# Patient Record
Sex: Male | Born: 1955 | Race: Black or African American | Hispanic: No | Marital: Married | Smoking: Never smoker
Health system: Southern US, Community
[De-identification: ages and names within clinical notes are randomized; demographics above are authoritative.]

## PROBLEM LIST (undated history)

## (undated) DIAGNOSIS — E119 Type 2 diabetes mellitus without complications: Secondary | ICD-10-CM

## (undated) DIAGNOSIS — I10 Essential (primary) hypertension: Secondary | ICD-10-CM

## (undated) HISTORY — DX: Type 2 diabetes mellitus without complications: E11.9

## (undated) HISTORY — DX: Essential (primary) hypertension: I10

---

## 1999-04-23 ENCOUNTER — Emergency Department (HOSPITAL_COMMUNITY): Admission: EM | Admit: 1999-04-23 | Discharge: 1999-04-23 | Payer: Self-pay | Admitting: Emergency Medicine

## 2004-04-03 ENCOUNTER — Ambulatory Visit
Admit: 2004-04-03 | Disposition: A | Discharge status: Home / Self Care | Payer: Self-pay | Source: Ambulatory Visit | Admitting: Internal Medicine

## 2004-06-13 ENCOUNTER — Emergency Department: Admit: 2004-06-13 | Payer: Self-pay | Source: Emergency Department | Admitting: Emergency Medicine

## 2004-06-13 ENCOUNTER — Emergency Department: Admit: 2004-06-13 | Payer: Self-pay | Source: Emergency Department | Admitting: Pediatric Emergency Medicine

## 2006-05-26 ENCOUNTER — Emergency Department: Admit: 2006-05-26 | Payer: Self-pay | Source: Emergency Department

## 2008-09-28 ENCOUNTER — Emergency Department: Payer: Self-pay | Admitting: Emergency Medicine

## 2010-03-02 ENCOUNTER — Inpatient Hospital Stay
Admission: EM | Admit: 2010-03-02 | Disposition: A | Discharge status: Other Acute Inpatient Hospital | Payer: Self-pay | Source: Emergency Department | Admitting: Internal Medicine

## 2010-03-08 LAB — COMPREHENSIVE METABOLIC PANEL
ALT: 25 U/L (ref 7–56)
ALT: 24 U/L (ref 7–56)
AST (SGOT): 20 U/L (ref 5–40)
AST (SGOT): 17 U/L (ref 5–40)
Albumin, Synovial: 4 g/dL (ref 3.9–5.0)
Albumin, Synovial: 3.6 g/dL — ABNORMAL LOW (ref 3.9–5.0)
Alkaline Phosphatase: 64 U/L (ref 38–126)
Alkaline Phosphatase: 57 U/L (ref 38–126)
BUN / Creatinine Ratio: 16 (ref 8–20)
BUN / Creatinine Ratio: 20 (ref 8–20)
BUN: 24 mg/dL — ABNORMAL HIGH (ref 6–20)
BUN: 25 mg/dL — ABNORMAL HIGH (ref 6–20)
Bilirubin, Total: 0.6 mg/dL (ref 0.2–1.3)
Bilirubin, Total: 0.5 mg/dL (ref 0.2–1.3)
CO2: 26 mmol/L (ref 21.0–31.0)
CO2: 22 mmol/L (ref 21.0–31.0)
Calcium: 9.6 mg/dL (ref 8.4–10.2)
Calcium: 9 mg/dL (ref 8.4–10.2)
Chloride: 104 mmol/L (ref 101–111)
Chloride: 108 mmol/L (ref 101–111)
Creatinine: 1.54 mg/dL — ABNORMAL HIGH (ref 0.66–1.25)
Creatinine: 1.23 mg/dL (ref 0.66–1.25)
EGFR: 47 mL/min/{1.73_m2}
EGFR: 57 mL/min/{1.73_m2}
EGFR: >60 mL/min/{1.73_m2}
EGFR: >60 mL/min/{1.73_m2}
Glucose: 142 mg/dL — ABNORMAL HIGH (ref 70–100)
Glucose: 167 mg/dL — ABNORMAL HIGH (ref 70–100)
Potassium: 4.4 mmol/L (ref 3.6–5.0)
Potassium: 4.9 mmol/L (ref 3.6–5.0)
Protein, Total: 7.4 g/dL (ref 6.3–8.2)
Protein, Total: 6.6 g/dL (ref 6.3–8.2)
Sodium: 141 mmol/L (ref 135–145)
Sodium: 140 mmol/L (ref 135–145)

## 2010-03-08 LAB — CBC AND DIFFERENTIAL
BASOPHILS %: 0.2 % (ref 0.0–2.0)
BASOPHILS %: 0.1 % (ref 0.0–2.0)
Baso(Absolute): 0.02 10*3/uL (ref 0.00–0.20)
Baso(Absolute): 0.01 10*3/uL (ref 0.00–0.20)
Eosinophils %: 0.4 % (ref 0.0–6.0)
Eosinophils %: 0 % (ref 0.0–6.0)
Eosinophils Absolute: 0.05 10*3/uL — ABNORMAL LOW (ref 0.10–0.30)
Eosinophils Absolute: 0 10*3/uL — ABNORMAL LOW (ref 0.10–0.30)
Hematocrit: 39.5 % (ref 39.0–49.0)
Hematocrit: 38.9 % — ABNORMAL LOW (ref 39.0–49.0)
Hematocrit: 39.8 % (ref 39.0–49.0)
Hemoglobin: 13.8 g/dL (ref 13.2–17.3)
Hemoglobin: 13.2 g/dL (ref 13.2–17.3)
Hemoglobin: 14.3 g/dL (ref 13.2–17.3)
Immature Granulocytes #: 0.07 10*3/uL — ABNORMAL HIGH (ref 0.00–0.05)
Immature Granulocytes #: 0.06 10*3/uL — ABNORMAL HIGH (ref 0.00–0.05)
Immature Granulocytes %: 0.6 % — ABNORMAL HIGH (ref 0.0–0.0)
Immature Granulocytes %: 0.6 % — ABNORMAL HIGH (ref 0.0–0.0)
Lymphocytes Absolute: 1.84 10*3/uL (ref 1.00–4.80)
Lymphocytes Absolute: 0.98 10*3/uL — ABNORMAL LOW (ref 1.00–4.80)
Lymphocytes Relative: 16.3 % — ABNORMAL LOW (ref 25.0–55.0)
Lymphocytes Relative: 9.9 % — ABNORMAL LOW (ref 25.0–55.0)
MCH: 30.5 pg (ref 27.0–34.0)
MCH: 29.8 pg (ref 27.0–34.0)
MCH: 30.4 pg (ref 27.0–34.0)
MCHC: 34.9 g/dL (ref 32.0–36.0)
MCHC: 33.9 g/dL (ref 32.0–36.0)
MCHC: 35.9 g/dL (ref 32.0–36.0)
MCV: 87.2 fL (ref 80–100)
MCV: 87.8 fL (ref 80–100)
MCV: 84.5 fL (ref 80–100)
MPV: 9.7 fL (ref 9.0–13.0)
MPV: 9.6 fL (ref 9.0–13.0)
MPV: 9.5 fL (ref 9.0–13.0)
Monocytes Absolute: 1.01 10*3/uL (ref 0.10–1.20)
Monocytes Absolute: 0.09 10*3/uL — ABNORMAL LOW (ref 0.10–1.20)
Monocytes Relative %: 9 % — ABNORMAL HIGH (ref 1.0–8.0)
Monocytes Relative %: 0.9 % — ABNORMAL LOW (ref 1.0–8.0)
Neutrophils Absolute: 8.36 10*3/uL — ABNORMAL HIGH (ref 1.80–7.70)
Neutrophils Absolute: 8.83 10*3/uL — ABNORMAL HIGH (ref 1.80–7.70)
Neutrophils Relative %: 74.1 % — ABNORMAL HIGH (ref 49.0–69.0)
Neutrophils Relative %: 89.1 % — ABNORMAL HIGH (ref 49.0–69.0)
Nucleated RBC %: 0 /100WBC (ref 0.0–0.0)
Nucleated RBC %: 0 /100WBC (ref 0.0–0.0)
Nucleted RBC #: 0 10*3/uL (ref 0.00–0.00)
Nucleted RBC #: 0 10*3/uL (ref 0.00–0.00)
Platelets: 394 10*3/uL (ref 150–400)
Platelets: 392 10*3/uL (ref 150–400)
Platelets: 373 10*3/uL (ref 150–400)
RBC: 4.53 M/uL (ref 3.80–5.40)
RBC: 4.43 M/uL (ref 3.80–5.40)
RBC: 4.71 M/uL (ref 3.80–5.40)
RDW: 12.3 % (ref 11.0–14.0)
RDW: 12.3 % (ref 11.0–14.0)
RDW: 12.2 % (ref 11.0–14.0)
WBC: 11.28 10*3/uL — ABNORMAL HIGH (ref 4.80–10.80)
WBC: 9.91 10*3/uL (ref 4.80–10.80)
WBC: 10.91 10*3/uL — ABNORMAL HIGH (ref 4.80–10.80)

## 2010-03-08 LAB — CHLAMYDIA ANTIBODIES
C. Pneumoniae IgG Titer: 1:512 {titer}
C. Pneumoniae IgM Titer: <1:20 {titer}
C. Psittaci IgG Titer: 1:128 {titer}
C. Psittaci IgM Titer: <1:20 {titer}
C. Trachomatis IgG Titer: 1:128 {titer}
C. Trachomatis IgM Titer: <1:20 {titer}

## 2010-03-08 LAB — BASIC METABOLIC PANEL
BUN / Creatinine Ratio: 20 (ref 8–20)
BUN / Creatinine Ratio: 23 — ABNORMAL HIGH (ref 8–20)
BUN: 18 mg/dL (ref 6–20)
BUN: 20 mg/dL (ref 6–20)
CO2: 22 mmol/L (ref 21.0–31.0)
CO2: 27 mmol/L (ref 21.0–31.0)
Calcium: 9 mg/dL (ref 8.4–10.2)
Calcium: 9.5 mg/dL (ref 8.4–10.2)
Chloride: 107 mmol/L (ref 101–111)
Chloride: 99 mmol/L — ABNORMAL LOW (ref 101–111)
Creatinine: 0.91 mg/dL (ref 0.66–1.25)
Creatinine: 0.86 mg/dL (ref 0.66–1.25)
EGFR: >60 mL/min/{1.73_m2}
EGFR: >60 mL/min/{1.73_m2}
EGFR: >60 mL/min/{1.73_m2}
EGFR: >60 mL/min/{1.73_m2}
Glucose: 218 mg/dL — ABNORMAL HIGH (ref 70–100)
Glucose: 148 mg/dL — ABNORMAL HIGH (ref 70–100)
Potassium: 4.4 mmol/L (ref 3.6–5.0)
Potassium: 3.9 mmol/L (ref 3.6–5.0)
Sodium: 140 mmol/L (ref 135–145)
Sodium: 137 mmol/L (ref 135–145)

## 2010-03-08 LAB — INFLUENZA ANTIGEN
Influenza A: NEGATIVE
Influenza B: NEGATIVE

## 2010-03-08 LAB — T4, FREE: T4 Free: 1.14 ng/dL (ref 0.78–2.19)

## 2010-03-08 LAB — MAN DIFF ONLY
Basophil # Calc: 0 10*3/uL (ref 0.00–0.20)
Basophils Manual: 0 % (ref 0.00–2.00)
CELLS COUNTED: 100
Cell Morphology:: NORMAL
Eosinoph # Calc: 0 10*3/uL — ABNORMAL LOW (ref 0.10–0.30)
Eosinophils Manual: 0 % (ref 0.00–6.00)
Lymph # Calc: 3.49 10*3/uL (ref 1.00–4.80)
Lymphocytes Manual: 32 % (ref 25.00–55.00)
Manual NRBC: 0 /100WBC (ref 0.0–0.0)
Monocyte # Calc: 0.76 10*3/uL (ref 0.10–1.20)
Monocytes: 7 % (ref 1.00–8.00)
Neut # Calc: 6.66 10*3/uL (ref 1.80–7.70)
Nucleated RBC Calculated: 0 10*3/uL (ref 0.00–0.00)
Platelet Evaluation: NORMAL
Segmented Neutrophils: 61 % (ref 49.00–69.00)

## 2010-03-08 LAB — ANA SCREEN ONLY: Nuclear Antibody (ANA), IgG: NOT DETECTED

## 2010-03-08 LAB — TROPONIN I: Troponin I: 0.02 ng/mL (ref 0.000–0.034)

## 2010-03-08 LAB — PT (PROTHROMBIN TIME)
PT INR: 1.3
PT INR: 1.2
PT: 14.1 s — ABNORMAL HIGH (ref 10.6–12.8)
PT: 13.1 s — ABNORMAL HIGH (ref 10.6–12.8)

## 2010-03-08 LAB — URINALYSIS
Bilirubin, UA: NEGATIVE
Blood, UA: NEGATIVE
Glucose, UA: NEGATIVE
Leukocyte Esterase, UA: NEGATIVE
Nitrate: NEGATIVE
Specific Gravity, UR: 1.027 (ref 1.000–1.035)
Urobilinogen, UA: NORMAL
pH, Urine: 6 (ref 5.0–8.0)

## 2010-03-08 LAB — SEDIMENTATION RATE, AUTOMATED
Sed Rate: 46 — ABNORMAL HIGH (ref 0–20)
Sed Rate: 25 — ABNORMAL HIGH (ref 0–20)

## 2010-03-08 LAB — URINALYSIS WITH MICROSCOPIC

## 2010-03-08 LAB — MORPH REVIEW
Cell Morphology:: NORMAL
Platelet Evaluation: NORMAL

## 2010-03-08 LAB — TOXICOLOGY SCREEN, SERUM
Barbiturate Screen, UR: NEGATIVE
Benzodiazepine Screen, UR: NEGATIVE
Cocaine Screen: NEGATIVE
Opiate Screen: NEGATIVE
PCP Screen: NEGATIVE
THC Screen: NEGATIVE
Urine Amphetamine Screen: NEGATIVE

## 2010-03-08 LAB — MYCOPLASMA PNEUMONIAE ANTIBODY, IGG: Mycoplasma pneumoniae Antibody, IgG: 0.11 U/L

## 2010-03-08 LAB — GLYCOHEMOGLOBIN
Estimated Average Glucose: 146 mg/dL
Hemoglobin A1C: 6.7 % — ABNORMAL HIGH (ref 4.0–5.6)

## 2010-03-08 LAB — RHEUMATOID FACTOR: Rheumatoid Factor: <7 IU/mL (ref 0–14)

## 2010-03-08 LAB — LEGIONELLA ANTIGEN, URINE: Legionella Pneumophila Ag, UR: NEGATIVE

## 2010-03-08 LAB — MAGNESIUM: Magnesium: 2.2 mg/dL (ref 1.7–2.2)

## 2010-03-08 LAB — TSH: TSH, 3rd Generation: 0.07 mIU/L — ABNORMAL LOW (ref 0.465–4.680)

## 2010-03-08 LAB — STREPTOCOCCUS PNEUMONIAE ANTIGEN, URINE: Urine, Strep pneumoniae Antigen: NEGATIVE

## 2010-03-08 LAB — LIPASE: Lipase: 107 U/L (ref 23–300)

## 2010-03-08 LAB — CK: Creatine Kinase (CK): 170 U/L (ref 19–216)

## 2010-03-08 LAB — ANGIOTENSIN CONVERTING ENZYME: Angiotensin-Converting Enzyme: 17 U/L (ref 9–67)

## 2010-03-08 LAB — PTT (PARTIAL THROMBOPLASTIN TIME)
PTT Ratio: 1 (ref 0.8–1.2)
PTT: 28.2 s (ref 21.6–34.0)

## 2010-03-08 LAB — T3: T3, Total: 1 ng/mL (ref 0.97–1.69)

## 2010-03-08 LAB — PHOSPHORUS: Phosphorus: 3.8 mg/dL (ref 2.4–4.4)

## 2011-08-29 NOTE — Consults (Signed)
Jeremy Hunt, Jeremy Hunt                                                    MRN:          [id number]                                                          Account:      [id number]                                                     Document ID:  [id number]                                               Service Date: 03/05/2010                                                                                    MRN: [id number]  Document ID: [id number]  Admit Date: 03/02/2010     Patient Location: [id number]   Patient Type: I     CONSULTING PHYSICIAN: Weston Brass MD     REFERRING PHYSICIAN:         REASON FOR CONSULTATION:  Chronic cough and abnormal CT.     HISTORY OF PRESENT ILLNESS:  This is a 55 year old male who was admitted with multiple symptoms, most of  which includes a chronic loose, but nonproductive cough, and shortness of  breath.  His shortness of breath has been ongoing for a year.  However, the  patient states this does not prevent him from swimming 2 to 3 miles per  day.  He complains of a chronic nonproductive cough with occasional  wheezing.  His sputum is usually clear in color.  He has had subjective  fevers, but temperature was taken and has had occasional night sweats here  in the hospital.  He denies any GI or GU symptoms.  The patient was  recently seen in [facility] and treated with outpatient antibiotics  but did not fill the prescriptions.  He has also had some generalize muscle  aches.  He complains of overall fatigue and has a was told he had a history  of sarcoidosis but has never had an official bronchoscopy or biopsy.     PAST MEDICAL HISTORY:  Hypertension, informal diagnosis of sarcoidosis.  He denies any history of  asthma.  There is a history of depression.     PAST SURGICAL HISTORY:  Negative.     ALLERGIES:  Has no known drug allergies.  OUTPATIENT MEDICATIONS:  Amlodipine, lisinopril, Seroquel, neck cervical aspirin and Tylenol with  codeine.     SOCIAL HISTORY:  The patient is a  lifelong nonsmoker, does not drink alcohol or use drugs.                                                                                                           Page 1 of 3  Jeremy Hunt, Jeremy Hunt                                                    MRN:          [id number]                                                          Account:      [id number]                                                     Document ID:  [id number]                                               Service Date: 03/05/2010                                                                                       FAMILY HISTORY:  Negative.     PHYSICAL EXAMINATION:  VITAL SIGNS:  On admission, the patient was normotensive.  His room air  saturation was 94%, respirations were even 18 and unlabored.  GENERAL:  He is well-developed, thin male, well-built, in no acute distress  except for an ongoing cough.  HEENT:  Within normal limits.  NECK:  Without JVD, thyromegaly, or bruits.  HEART:  Regular with a normal S1, S2.  LUNGS:  Reveals occasional scattered expiratory wheezes.  Otherwise, lungs  are clear.  ABDOMEN:  Benign.  EXTREMITIES:  There is discoloration of the nailbeds, no cyanosis or  clubbing.     LABORATORY DATA:  CT scan of the thorax shows no axillary adenopathy.  This is a noncontrast  scan.  There  may possibly be mild mediastinal adenopathy with precarinal  lymph nodes, which are mildly enlarged.  There is calcified mediastinal and  hilar lymph nodes.  There is evidence of scarring in the upper lobes with  retraction of the hilar regions.  There is scattered nodularity in the  upper lobes as well and a patchy opacity in both upper lobes, right greater  than left.  There is also micronodular changes in the right lower lobe.   His laboratories are as follows:  WBC is 9.1 with an H and H 13 and 38,  platelet count of 392. Chemistries:  Sodium is 140, potassium 4.4, chloride  107, bicarbonate 22, BUN 18, glucose 218, creatinine 0.9, calcium 9.0.    Legionella and streptococcal antigen are negative.     IMPRESSION:  1.  Chronic cough with abnormal CT.  The patient has an informal diagnosis  of sarcoidosis and certainly his CAT scan could be consistent with this  diagnosis.  However, other concerns include a community-acquired pneumonia,  tuberculosis is lower on the differential.  The patient states he had a  negative PPD within the last 6 to 12 months.  2.  History of hypertension with stage III with history of hypertension  with now normalizing creatinine.     RECOMMENDATIONS:  I would obtain a high-resolution CT scan for better definition of the  pulmonary infiltrates, place a PPD.  Obtain sputum cultures.  Keep patient  n.p.o. for possible bronchoscopy with transbronchial lung biopsy for  diagnostic purposes.  Currently, he is on corticosteroids.  I would                                                                                                           Page 2 of 3  Jeremy Hunt, Jeremy Hunt                                                    MRN:          [id number]                                                          Account:      [id number]                                                     Document ID:  [id number]  Service Date: 03/05/2010                                                                                    continue this at a lower dose, nebulizer treatments, empiric antibiotics as  you are doing.  We will follow with you.              D:  03/05/2010 08:45 AM by Gwenlyn Saran. Maisie Fus, MD [phone])  T:  03/05/2010 18:15 PM by Lynnell Grain      Everlean Cherry: [id number]) (Doc ID: [id number])        cc:                                                                                                            Page 3 of 3  Authenticated by Weston Brass, MD On 03/15/2010 08:42:59 AM

## 2011-08-29 NOTE — Procedures (Signed)
Jeremy Hunt, Jeremy Hunt                                                    MRN:          [id number]                                                          Account:      [id number]                                                     Document ID:  [id number]                                               Service Date: 03/05/2010                                                                                    MRN: [id number]  Document ID: [id number]  Admit Date: 03/02/2010  Order Number:      Patient Location: [id number]   Patient Type: I     PHYSICIAN/PROVIDER: Weston Brass MD     REFERRING PHYSICIAN:         TITLE OF PROCEDURE:  Pulmonary bedside spirometry pulmonary function test.     FINDINGS:    This is a bedside pre- and postbronchodilator study.  The FEV-1% is within  normal limits.  However, all other spirometry values are reduced.  Post  bronchodilator there is some, but otherwise insignificant, improvement of  the FEV-1 post bronchodilator.     IMPRESSION:   Probable moderate restriction, minimal improvement post bronchodilator, not  considered significant.  Full PFT should be considered.              D:  03/05/2010 08:51 AM by Gwenlyn Saran. Maisie Fus, MD [phone])  T:  03/06/2010 09:06 AM by Minda Meo      (Conf: [id number]) (Doc ID: [id number])        cc:  Page 1 of 1  Authenticated by Weston Brass, MD On 03/15/2010 08:43:00 AM

## 2011-08-29 NOTE — Discharge Summary (Signed)
Jeremy Hunt, Jeremy Hunt                                                    MRN:          [id number]                                                          Account:      [id number]                                                     Document ID:  [id number]                                                                                                                                   MRN: [id number]  Document ID: [id number]  Admit Date: 03/02/2010  Discharge Date: 03/07/2010     ATTENDING PHYSICIAN:  DIANE TRAFICANTE, DO        HISTORY OF PRESENT ILLNESS:  Please refer to the initial history and physical dictated on March 02, 2010  by Dr. Freddi Che for initial medical details.  Briefly, this is a  55 year old African American male who was diagnosed with asthmatic  bronchitis seen in the emergency room at [facility] on February 25, 2010.  He had been placed on Levaquin as well as prednisone.  The patient  never did get the prescriptions filled and continued to complain of dyspnea  on exertion with a productive cough with mild nausea and chills.  He does  carry a diagnosis of sarcoidosis as well as hypertension.       HOSPITAL COURSE:   The patient underwent a CAT scan of his thorax on March 05, 2010, which  revealed prominent reticular and reticulonodular opacities bilaterally.   The opacities were more prominent within the upper lobes compared with the  lower lobes.  There was subpleural nodular densities present.  Central  bronchovascular thickening was observed as well as mild traction  bronchiectasis.  Bolus changes were also noted along the medial aspect of  the left lung base.  There were no severe fibrocystic changes noted.  The  constellation of findings was compatible with chronic pulmonary  sarcoidosis.  The patient was seen in consultation by Dr. Standley Dakins,  pulmonology, who recommended steroids, antibiotic, and an eventual  bronchoscopy with transbronchial lung biopsy for diagnostic purposes.    Labwork on the date  of discharge revealed a white count of 10.7, hemoglobin  14.3, hematocrit 39.8, platelets 373, BUN 20, creatinine 0.8, sodium 137,  potassium 3.9, chloride 99, CO2 27, and glucose 148.  His ACE level was 17.   Blood cultures were negative for 4 days.     DISCHARGE MEDICATIONS:  Aspirin 81 mg daily, Ceftin 250 mg b.i.d., Combivent four times a day,  Floranex 1 package b.i.d., Norvasc 10 mg daily, Seroquel 200 mg daily,  Percocet 1 tablet q.4 h. p.r.n. for pain, Zithromax 500 mg daily for 4  days, clonidine 0.1 mg b.i.d., prednisone 40 mg daily.     IMPRESSION:  1.  Chronic pulmonary sarcoidosis.  2.  Acute bronchitis.  3.  Moderate restrictive disease.                                                                                                           Page 1 of 2  Jeremy Hunt, Jeremy Hunt                                                    MRN:          [id number]                                                          Account:      [id number]                                                     Document ID:  [id number]                                                                                                                                   4.  Hypertension.  5.  Depression.  6.  Sick euthyroid.     PLAN:  1.  The patient will follow up with Dr. Standley Dakins, pulmonologist, in  approximately 1 week to schedule an outpatient bronchoscopy for  transbronchial biopsy.  2.  Intravenous steroid Solu-Medrol was converted to oral prednisone.  3.  Clonidine was started for treatment of hypertension.  4.  He will continue on the above-listed antibiotics.     This discharge was greater than 30 minutes.              D:  03/21/2010 18:12 PM by Christoper Fabian, NP ([id number])  T:  03/22/2010 14:25 PM by Georges Lynch      (Conf: [id number]) (Doc ID: [id number])           cc: Standley Dakins MD                                                                                                           Page 2 of 2  Authenticated by  Christel Mormon, M.D. On 03/25/2010 02:34:00 PM   Authenticated by Cheryll Dessert, M.D. On 03/28/2010 10:44:03 AM

## 2011-08-29 NOTE — H&P (Signed)
Jeremy Hunt, Jeremy Hunt                                                    MRN:          [id number]                                                          Account:      [id number]                                                     Document ID:  [id number]                                                                                                                                   MRN: [id number]  Admit Date: 03/02/2010     Patient Location: [id number]   Patient Type: I     ATTENDING PHYSICIAN: DIANE TRAFICANTE, DO        CHIEF COMPLAINT:  Pneumonia.     HISTORY OF PRESENT ILLNESS:  This 55 year old male is a vague and evasive historian due to "I am too sick to   talk." He was seen at [facility] ER on February 25, 2010. He was   diagnosed with asthmatic bronchitis, given Levaquin and prednisone and   discharged home. He did not get his prescriptions filled because he was "too   sick to do so."  He has had 3 weeks of shortness of breath, dyspnea on   exertion, productive cough of initially white, now sticky sputum, subjective   fevers, chills (no rigors), sweats, nausea without vomiting, and malaise.  No   recent travel, ill contacts, chest pain or discomfort, paroxysmal nocturnal   dyspnea, dyspnea on exertion, headache, vertigo, tinnitus, sinus/nasal   congestion, sore throat, otalgia, visual disturbances, back pain, abdominal   pain, vomiting, constipation, UTI symptoms, melena, hematochezia, hematemesis,   hematuria, arthralgias, rash. He has had generalized myalgias and in the last   week has had loose stools but not true diarrhea and no blood or mucus. He has   had decreased oral intake for the past week because he is "too tired to eat or   drink" and anorexia. He has had pneumonia in the remote past, but never this   severe. He does have a history of sarcoidosis per [facility] medical  records and states he has "lung issues." I asked  him if he had asthma, he   stated "yes," but it is unclear. He  has had some wheezing in the last week,   especially with exertion.     PAST MEDICAL HISTORY:  Hypertension.    Sarcoidosis.    Asthma.    Depression.     PAST SURGICAL HISTORY:  None.     ALLERGIES:  No known drug allergies.     CURRENT MEDICATIONS:  Tylenol with codeine one every 4 hours as needed for pain.  Amlodipine 10 mg daily.  Lisinopril 10 mg daily.                                                                                                           Page 1 of 4  ASHLEY, MONTMINY                                                    MRN:          [id number]                                                          Account:      [id number]                                                     Document ID:  [id number]                                                                                                                                   Seroquel 200 mg at night.  Aspirin 81 mg daily.    He was supposed to be on   Levaquin 750 mg and prednisone taper prescribed at   [facility] ER on February 25, 2010, but he did not get the   prescriptions filled.     SOCIAL HISTORY:  Lifelong nonsmoker, no alcohol or drug abuse. He lives with his girlfriend.     FAMILY HISTORY:  Negative  for early CAD, CVA, or VTE.     REVIEW OF SYSTEMS:  February 25, 2010, [facility] ED visit: WBC 8.78 with 67 segs, 18   lymphocytes, 11 monocytes, 1 eosinophil, 3 atypical lymphocytes, hemoglobin   14.7, hematocrit 41.7, platelet count 230,000. Sodium 135, potassium 4.2,   chloride 99, CO2 27, glucose 115, BUN 15, creatinine 1.34, calcium 9.1.    Influenza A and B negative. Blood culture negative to date. Chest x-ray No   acute cardiopulmonary abnormalities. Diffuse fibrotic lung disease again   identified, which is stable without effusions or new abnormalities (compared   with December 13, 2009 [facility] chest x-ray).    Remainder of review of systems as noted above, otherwise negative.     PHYSICAL  EXAMINATION:  VITAL SIGNS: Blood pressure 171/125 initially, now 102/58, 103/64, pulse 100,   respirations 19, temperature 97.5. Room air saturation 94%, 98% on 2 liters   nasal cannula.  GENERAL: Well-developed, well-nourished, tall, thin, but not cachectic male in   no acute distress at this time, nontoxic but ill-appearing.  HEENT: PERRLA, EOMI. Sclerae anicteric, conjunctivae injected without exudate.   No sinus tenderness. Lips are very dry.  NECK: Supple, nontender, full range of motion, no lymphadenopathy, nuchal  rigidity, JVD, or carotid bruit.  HEART: Tachycardic S1, S2.  No murmur, gallop or rub.  CHEST: Nontender to palpation or compression.  LUNGS: Greatly diminished throughout due to poor respiratory effort with  scattered fine expiratory wheezes. Decreased respiratory excursion. Nonlabored   respirations. Normal speech when I can get him to talk to me.  ABDOMEN: Soft, nontender, nondistended, normoactive bowel sounds.  BACK: No CVA or spinal tenderness.  EXTREMITIES: No clubbing, cyanosis, edema or calf tenderness. Normal muscle   strength and tone.  NEURO: Alert and oriented when I can get him to talk to me, not very   cooperative during the exam.  SKIN: Warm, dry, mucous membranes dry, pale pink. Decreased turgor with doughy   texture. No rash.  PSYCHIATRIC: No eye contact. Flat affect. Depressed mood. Well-groomed.                                                                                                           Page 2 of 4  BATES, COLLINGTON                                                    MRN:          [id number]                                                          Account:      [id number]  Document ID:  [id number]                                                                                                                                   ASSESSMENT AND PLAN:  WBC 11.28 with 74 neutrophils, 16 lymphocytes, 9 monocytes, 1  eosinophil, 1  basophil. Hemoglobin 13.8, hematocrit 39.5; platelet count 304,000. Sodium 141,   potassium 4.4, chloride 104, CO2 26, glucose 142, BUN 24, creatinine 1.54 with   estimated GFR 57 mL/min/1.73 sqm. CK, lipase, troponin are all normal.    Influenza A and B negative. TSH 0.070, free T4 and total T3 pending at time of   this dictation. PT/INR 1.3, PTT ratio 1.     Portable chest x-ray (reviewed by me): Abnormal exam with reticulonodular  parenchymal opacities bilaterally. Uncertain chronicity and acute infectious   process should be considered.     EKG (read by me): Sinus rhythm 80 b.p.m. Indeterminate axis. LVH by voltage   criteria. Possible left anterior hemiblock. Anterolateral T-wave changes likely   due to hypertrophy. No comparison EKG.       ASSESSMENT AND PLAN    1.  Community-acquired pneumonia with dehydration.        He did not fill his antibiotic prescription after his [facility] emergency department visit on February 25, 2010 because he was \"too       sick.\"  Routine pneumonia orders including atypical serologies as well as       urine for Legionella and pneumococcal antigens. Empiric Rocephin and       Zithromax. Aggressive intravenous hydration. Blood cultures x2 are pending.    2.  Hypertension, low normotensive due to #1.        Continue current medications and follow trends.    3.  Asthma/wheezing/sarcoidosis,        DuoNebs p.r.n. shortness of breath/wheezing. Intravenous steroids. Follow       clinical course. Check chest CT.    4.  Stage III chronic kidney disease.        There is probably a prerenal azotemia component due to dehydration even       though he has a normal BUN/creatinine ratio. I suspect that he does have       underlying stage III chronic kidney disease due to hypertension.       Intravenous fluids and follow trends. One week ago his creatinine was 1.34       at [facility].    5.  Hyperglycemia without diagnosis of diabetes mellitus.         Accu-Cheks a.c. and at bedtime since we will be using IV steroids. Sliding       scale insulin coverage. Check hemoglobin A1c to rule out occult diabetic       state.  Page 3 of 4  VALERIANO, BAIN                                                    MRN:          [id number]                                                          Account:      [id number]                                                     Document ID:  [id number]                                                                                                                                   6.  Decreased TSH.        Free T4 and total T3 are pending at time of this dictation. If they are       abnormal, he likely has a hyperthyroid state and further workup needs to be       done as well as treatment. This could account for him being so thin,       although he denies recent weight loss. This could also be euthyroid sick       syndrome due to current illness.    7.  Venous thromboembolic prophylaxis with renal dose Lovenox.    8.  Gastrointestinal prophylaxis with Pepcid and Lactinex.              D:  03/02/2010 23:32 PM by Drucilla Schmidt TRAFICANTE, DO ([id number])  T:  03/03/2010 05:26 AM by Marylene Buerger        cc:                                                                                                                Page 4 of 4  Authenticated and Edited by Freddi Che, DO  On 03/19/10 10:02:33 AM

## 2012-08-21 LAB — ECG 12-LEAD
Atrial Rate: 81 {beats}/min
Atrial Rate: 93 {beats}/min
P Axis: 46 degrees
P Axis: 50 degrees
P-R Interval: 204 ms
P-R Interval: 204 ms
Q-T Interval: 356 ms
Q-T Interval: 360 ms
QRS Duration: 90 ms
QRS Duration: 98 ms
QTC Calculation (Bezet): 418 ms
QTC Calculation (Bezet): 442 ms
R Axis: -6 degrees
R Axis: 5 degrees
T Axis: 61 degrees
T Axis: 81 degrees
Ventricular Rate: 81 {beats}/min
Ventricular Rate: 93 {beats}/min

## 2014-03-15 ENCOUNTER — Emergency Department: Payer: No Typology Code available for payment source

## 2014-03-15 ENCOUNTER — Other Ambulatory Visit: Payer: Self-pay

## 2014-03-15 ENCOUNTER — Emergency Department
Admission: EM | Admit: 2014-03-15 | Discharge: 2014-03-15 | Disposition: A | Discharge status: Home / Self Care | Payer: No Typology Code available for payment source | Attending: Emergency Medicine | Admitting: Emergency Medicine

## 2014-03-15 DIAGNOSIS — I10 Essential (primary) hypertension: Secondary | ICD-10-CM | POA: Insufficient documentation

## 2014-03-15 DIAGNOSIS — M5412 Radiculopathy, cervical region: Secondary | ICD-10-CM | POA: Insufficient documentation

## 2014-03-15 DIAGNOSIS — IMO0002 Reserved for concepts with insufficient information to code with codable children: Secondary | ICD-10-CM | POA: Insufficient documentation

## 2014-03-15 DIAGNOSIS — S46912A Strain of unspecified muscle, fascia and tendon at shoulder and upper arm level, left arm, initial encounter: Secondary | ICD-10-CM

## 2014-03-15 DIAGNOSIS — X58XXXA Exposure to other specified factors, initial encounter: Secondary | ICD-10-CM | POA: Insufficient documentation

## 2014-03-15 DIAGNOSIS — D1739 Benign lipomatous neoplasm of skin and subcutaneous tissue of other sites: Secondary | ICD-10-CM | POA: Insufficient documentation

## 2014-03-15 MED ORDER — HYDROCODONE-ACETAMINOPHEN 5-300 MG PO TABS
1.0000 | ORAL_TABLET | Freq: Four times a day (QID) | ORAL | 0 refills | Status: DC | PRN
Start: 2014-03-15 — End: 2018-03-13
  Filled 2014-03-15: qty 16, 2d supply, fill #0

## 2014-03-15 MED ORDER — OXYCODONE-ACETAMINOPHEN 5-325 MG PO TABS
1.0000 | ORAL_TABLET | Freq: Once | ORAL | Status: AC
Start: 2014-03-15 — End: 2014-03-15
  Administered 2014-03-15: 1 via ORAL
  Filled 2014-03-15: qty 1

## 2014-03-15 NOTE — ED Provider Notes (Signed)
I have reviewed and agree with history. The pertinent physical exam has been documented. The pt was seen and examined by PA or NP under my supervision. The plan of care was discussed with me.    afeb vss  Neck supple, NT  Lungs clear  3 cm lipoma over L AC , no erythema  LUE 5/5 strength, distal NV intact    Reassessment:   Re-evaluated patient, shared results, answered patient questions, reviewed disposition plan.  A stabilizing H&P was performed.Pt. stable with treatment offered in the emergency department and a medical evaluation did not reveal any immediate life or limb threats. Disposition and f/u options were outlined. They were instructed to follow up with their regular doctor in 2 days and return to the ER if they developed any new symptoms or if they had any other concerns. They verbalized understanding of the plan of care and felt comfortable with the plan.            Arlana Lindau, MD  03/15/14 2200

## 2014-03-15 NOTE — Discharge Instructions (Signed)
Follow-up with your orthopedist this week.    Shoulder Strain    You have been diagnosed with a shoulder strain.    The shoulder joint is surrounded by several muscles. A strain happens when a muscle is stretched or partly torn. This usually happens from using the muscle too much or doing an activity the muscle is not used to. Strains should not be confused with sprains. Sprains are injuries to the ligaments. Ligaments hold bones together.    An injury to the shoulder can be especially painful. This is because when you move your arm, you also have to move your shoulder.    Treatment often includes resting the shoulder and using pain medicines. A sling is sometimes used. If your doctor places you in a sling, it is important to take your arm out of the sling every 2 or 3 hours. Move it around. This way, your shoulder will not "freeze." If you get a "frozen shoulder," problems like more pain or severe (serious) stiffness may develop.    Treatment also includes using ice on the painful area. Putting ice on the affected area can reduce swelling and pain. Put some ice cubes in a re-sealable (Ziploc) bag and add some water. Put a thin washcloth between the bag and the skin. Apply the ice bag to the area for at least 20 minutes. Do this at least 4 times per day. It is okay to use the ice more often and to apply it longer. NEVER APPLY ICE DIRECTLY TO THE SKIN.    Try to keep the injured shoulder elevated (lifted). You can sit up in a chair or recliner and sleep on an extra pillow in bed at night.    Take the medicine your doctor has prescribed. These medicines often help with pain and inflammation.    YOU SHOULD SEEK MEDICAL ATTENTION IMMEDIATELY, EITHER HERE OR AT THE NEAREST EMERGENCY DEPARTMENT, IF ANY OF THE FOLLOWING OCCURS:   Numbness or tingling in your arm or hand.   A cool, pale hand.   Severe (serious) neck or shoulder pain that acetaminophen (Tylenol), ibuprofen (Advil or Motrin) or the medicine  prescribed by the doctor does not help.    Cervical Radiculopathy    You have been seen for a cervical radiculopathy.    Your spine has bones called "vertebrae." In between the bones there are soft cushions. These are called "disks." The disks keep the vertebrae from rubbing against each other. Inside of each disk is a thick, jelly-like substance called the "nucleus pulposus." Sometimes when the spine is injured, a disk is damaged and the nucleus pulposus leaks out of the disk. This is called a "disk herniation." As people age, the disks get brittle and delicate. If this happens, you might have a disk herniation. This is possible even if you don t remember getting injured.    Sometimes a herniated disk presses on a nerve in the spine. This causes pain near the herniated disk. Since you have symptoms in your neck and arm, the problem is in the cervical (neck) spine. Other problems can cause a radiculopathy (nerve pain), including a narrowed opening where the nerves come out.    Some symptoms of a cervical radiculopathy are:     Pain down one of your arms.   A burning feeling down your arm.   Numbness (pins and needles or loss of feeling) in your arm.   In serious cases, your arm may feel weak.    This problem  is often treated with rest, physical therapy, and medication for the swelling and pain. If these treatments don t work, surgery may be needed. Sometimes taking steroids (like Prednisone) for a few days can help the pain.    We don't believe your condition is dangerous right now. However, you need to be careful. Sometimes a problem that seems small can get serious later. Therefore, it is very important for you to come back here or go to the nearest Emergency Department if you don t get better or your symptoms get worse.     You may have been referred to get an MRI of your spine or an EMG. These tests look at your problem more closely.    Follow up with your doctor or the referral doctor as soon as  possible.    YOU SHOULD SEEK MEDICAL ATTENTION IMMEDIATELY, EITHER HERE OR AT THE NEAREST EMERGENCY DEPARTMENT, IF ANY OF THE FOLLOWING OCCURS:     You lose bowel or bladder control (you soil or wet yourself).   You feel week or can t use your arm(s).   The medication doesn t help the pain.   You have a fever (temperature higher than 100.90F or 38C) or shaking chills.   You have serious pain over one bone (vertebra) in your neck.    If you can t follow up with your doctor, or if at any time you feel you need to be rechecked or seen again, come back here or go to the nearest emergency department.

## 2014-03-15 NOTE — ED Provider Notes (Signed)
Physician/Midlevel provider first contact with patient: 03/15/14 1922         History     Chief Complaint   Patient presents with   . Abscess     HPI    58 y.o. 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 Pt reports that pain radiates down his L arm and is assoc with burning and tingling in his L hand digits. Has been seen by ortho and had XR done of his shoulder, all w/u negative. Has also tried 2 cortisone shots and taken percocet with no relief. Pt here today because pain felt significantly worse. No CP, nausea, SOB, fever, or any other pain. Last saw ortho 1.5 weeks ago.     Ortho: Dr. Gerome Apley     Past Medical History   Diagnosis Date   . Hypertension        History reviewed. No pertinent past surgical history.    History reviewed. No pertinent family history.    Social  History   Substance Use Topics   . Smoking status: Never Smoker    . Smokeless tobacco: Never Used   . Alcohol Use: No       .     No Known Allergies    Current/Home Medications    No medications on file        Review of Systems   Musculoskeletal:        +Lypoma to L shoulder  +Pain to L arm and shoulder  +burning/tingling to L hand digits   All other systems reviewed and are negative.        Physical Exam    BP: 167/96 mmHg, Heart Rate: 91 , Temp: 97.6 F (36.4 C), Resp Rate: 15 , SpO2: 99 %, Weight: 71.668 kg    Physical Exam   Constitutional: He is oriented to person, place, and time. He appears well-developed and well-nourished. No distress.   HENT:   Head: Normocephalic and atraumatic.   Eyes: Conjunctivae normal are normal. Right eye exhibits discharge. Left eye exhibits no discharge.   Neck: Normal range of motion. Neck supple.   Cardiovascular: Normal rate and regular rhythm.    Pulmonary/Chest: Effort normal and breath sounds normal.   Musculoskeletal:        Left shoulder: He exhibits decreased range of motion and tenderness. He exhibits no bony tenderness, no deformity and no pain.        Soft, nontender mass  top of L shoulder consistent with a lipoma   Neurological: He is alert and oriented to person, place, and time.   Skin: Skin is warm and dry. He is not diaphoretic.   Psychiatric: He has a normal mood and affect. His behavior is normal. Judgment and thought content normal.       MDM and ED Course     ED Medication Orders      Start     Status Ordering Provider    03/15/14 2024   oxyCODONE-acetaminophen (PERCOCET) 5-325 MG per tablet 1 tablet   Once      Route: Oral  Ordered Dose: 1 tablet         Last MAR action:  Given Lacey Jensen                 MDM      Procedures    Clinical Impression & Disposition     Clinical Impression  Final diagnoses:   Shoulder strain, left, initial encounter  Cervical radiculopathy        ED Disposition     Discharge Ezreal Liwanag discharge to home/self care.    Condition at disposition: Stable           Patient discharged by me at 9:23 PM.   Departure Condition: Good, stable  Mobility at Discharge: Ambulatory  Patient Teaching: Discharge instructions reviewed and patient verbalizes understanding  Departure Mode: By self      New Prescriptions    HYDROCODONE-ACETAMINOPHEN (VICODIN) 5-300 MG TAB    Take 1-2 tablets by mouth every 6 (six) hours as needed.        Treatment Team: Scribe: Ardis Rowan    Attestations:   I was acting as a Neurosurgeon for Normand Sloop, [place] on [employer]  Treatment Team: Scribe: Ardis Rowan    I am the first provider for this patient and I personally performed the services documented. Treatment Team: Scribe: Ardis Rowan is scribing for me on [facility]. This note accurately reflects work and decisions made by me.  [address] [place]      Normand Sloop [place], [place]  03/15/14 2140

## 2014-03-15 NOTE — ED Notes (Signed)
Reports growth to left shoulder x 1 year that started causing increased pain "recently" reports unable to drive due to pain, tingling and numbness to left fingers.

## 2019-01-19 ENCOUNTER — Emergency Department
Admission: EM | Admit: 2019-01-19 | Discharge: 2019-01-19 | Disposition: A | Discharge status: Home / Self Care | Payer: Medicaid Other | Attending: Emergency Medicine | Admitting: Emergency Medicine

## 2019-01-19 DIAGNOSIS — G894 Chronic pain syndrome: Secondary | ICD-10-CM | POA: Insufficient documentation

## 2019-01-19 DIAGNOSIS — Z7982 Long term (current) use of aspirin: Secondary | ICD-10-CM | POA: Insufficient documentation

## 2019-01-19 DIAGNOSIS — I10 Essential (primary) hypertension: Secondary | ICD-10-CM | POA: Insufficient documentation

## 2019-01-19 DIAGNOSIS — E119 Type 2 diabetes mellitus without complications: Secondary | ICD-10-CM | POA: Insufficient documentation

## 2019-01-19 MED ORDER — GABAPENTIN 300 MG PO CAPS
ORAL_CAPSULE | ORAL | 0 refills | Status: AC
Start: 2019-01-19 — End: 2019-02-02

## 2019-01-19 MED ORDER — KETOROLAC TROMETHAMINE 30 MG/ML IJ SOLN
15.00 mg | Freq: Once | INTRAMUSCULAR | Status: AC
Start: 2019-01-19 — End: 2019-01-19
  Administered 2019-01-19: 12:00:00 15 mg via INTRAMUSCULAR
  Filled 2019-01-19: qty 1

## 2019-01-19 NOTE — EDIE (Signed)
COLLECTIVE?NOTIFICATION?01/19/2019 11:05?Hunt, Jeremy?MRN: [id number]    Criteria Met      5 ED Visits in 12 Months    Security and Safety  No recent Security Events currently on file    ED Care Guidelines  There are currently no ED Care Guidelines for this patient. Please check your facility's medical records system.        Prescription Monitoring Program  040??- Narcotic Use Score  020??- Sedative Use Score  000??- Stimulant Use Score  190??- Overdose Risk Score  - All Scores range from 000-999 with 75% of the population scoring < 200 and on 1% scoring above 650  - The last digit of the narcotic, sedative, and stimulant score indicates the number of active prescriptions of that type  - Higher Use scores correlate with increased prescribers, pharmacies, mg equiv, and overlapping prescriptions  - Higher Overdose Risk Scores correlate with increased risk of unintentional overdose death   Concerning or unexpectedly high scores should prompt a review of the PMP record; this does not constitute checking PMP for prescribing purposes.      E.D. Visit Count (12 mo.)  Facility Visits   [facility]: [place] 1   [facility] 1   [facility] 1   [facility] 3   Total 6   Note: Visits indicate total known visits.      Recent Emergency Department Visit Summary  Date Facility [facility] Type Diagnoses or Chief Complaint   Jan 19, 2019 [facility] [place] [facility]      pinch nerve on left arm      Nov 16, 2018 [facility] [place] [place] [facility]      1. Acute pharyngitis, unspecified      1. Acute upper respiratory infection, unspecified      2. Other specified disorders of nose and nasal sinuses      3. Pure hypercholesterolemia, unspecified      4. Hyperlipidemia, unspecified      5. 1 Type 2 diabetes mellitus without complications      6. Essential (primary) hypertension      Oct 13, 2018 [facility] [place]  [facility]      Other chronic pain      Pain in right shoulder      Essential (primary) hypertension      1 Type 2 diabetes mellitus without complications      Pain in left shoulder      Jul 16, 2018 [facility] [place] [place] [facility]      Other chronic pain      Pain in left arm      Pain in thoracic spine      Pain in right arm      Pain in left shoulder      Essential (primary) hypertension      Long term (current) use of aspirin      Jul 05, 2018 [facility] [place] [facility]      Long term (current) use of aspirin      Essential (primary) hypertension      Primary osteoarthritis, left shoulder      Civilian activity done for income or pay      1 Type 2 diabetes mellitus without complications      Apr 15, 2018 [facility] [place] [place] [facility]      Long term (current)  use of aspirin      Pain in right shoulder      1 Type 2 diabetes mellitus without complications      Essential (primary) hypertension      Unspecified osteoarthritis, unspecified site      Pain in left shoulder          Recent Inpatient Visit Summary  No recorded inpatient visits.     Care Team  There is not a care team on record at this time.   Collective Portal  This patient has registered at the [facility] Emergency [facility]: [place] [facility]   For more information visit: [email or web address]     PLEASE NOTE:     1.   Any care recommendations and other clinical information are provided as guidelines or for historical purposes only, and providers should exercise their own clinical judgment when providing care.    2.   You may only use this information for purposes of treatment, payment or health care operations activities, and subject to the limitations of applicable Collective Policies.    3.   You should consult directly with the organization that provided a care guideline or other clinical history with any questions about additional  information or accuracy or completeness of information provided.    ? 2020 [place] - [email or web address]

## 2019-01-19 NOTE — ED Notes (Signed)
MD at bedside. 

## 2019-01-19 NOTE — Discharge Instructions (Signed)
Dear Jeremy Hunt:    Thank you for choosing the [facility] Emergency Department, the premier emergency department in the [place] area.  I hope your visit today was EXCELLENT.    Specific instructions for your visit today:      Chronic Pain Exacerbation    You have been seen for your chronic pain problem.    The Emergency Department/Urgent Care is available for emergencies related to your painful problem. However, you need to see your regular doctor for ongoing care for your pain.    ALL pain medicine refills MUST be done by your primary care doctor or the pain specialist who takes care of your pain.    The Emergency Department/Urgent Care is not the right place for the ongoing care of chronic pain.    If you have not seen a pain specialist, ask the doctor for information. He or she can give you information on who to contact and help refer you to one.    If you feel you are not able to get proper pain treatment, contact your health plan for advice.    YOU SHOULD SEEK MEDICAL ATTENTION IMMEDIATELY, EITHER HERE OR AT THE NEAREST EMERGENCY DEPARTMENT, IF ANY OF THE FOLLOWING OCCURS:   Any major change in your pain pattern.   Symptoms become worse.   You have any other concerns.    If you can't follow up with your doctor, or if at any time you feel you need to be rechecked or seen again, come back here or go to the nearest emergency department.             Arm Pain    You have been seen for arm pain.    Many things can cause arm pain. Most of the causes are not dangerous and will get better on their own. These can be muscle cramps, bruises, strains, pinched nerves, and Och skin infections for example.    You had an exam by your doctor today. He or she decided that the cause of your arm pain does not seem to be serious or dangerous.    If your pain continues, you might need another exam or more tests. This is to find out why you have arm pain. The cause of your symptoms doesn't seem dangerous now.  You don't need to stay in the hospital.    Though we dont believe your condition is dangerous right now, it is important to be careful. Sometimes a problem that seems mild can become serious later. This is why it is very important that you return here or go to the nearest Emergency Department if you are not improving or your symptoms are getting worse.    Some things you may try at home are:    Over-the-counter pain medications that have ibuprofen (Advil/Motrin) or acetaminophen (Tylenol) in them. Follow the directions on the package.   Rest the arm as needed. As soon as you are able, start moving your arm again to keep it from getting stiff.    Return here or go to the nearest Emergency Department or follow-up with your doctor if you are not getting better as expected.    Follow the instructions for any medication you are prescribed.     YOU SHOULD SEEK MEDICAL ATTENTION IMMEDIATELY, EITHER HERE OR AT THE NEAREST EMERGENCY DEPARTMENT, IF ANY OF THE FOLLOWING HAPPENS:     You have a fever (temperature higher than 100.82F or 38C).   Your pain does not go  away or gets worse.   Your arm or joints (elbow, wrist, shoulder, etc.) get red or swollen.   Your chest hurts or you get short of breath.   You have any other symptoms or concerns, or don't get better as expected.    If you can't follow up with your doctor, or if at any time you feel you need to be rechecked or seen again, come back here or go to the nearest emergency department.                 IF YOU DO NOT CONTINUE TO IMPROVE OR YOUR CONDITION WORSENS, PLEASE CONTACT YOUR DOCTOR OR RETURN IMMEDIATELY TO THE EMERGENCY DEPARTMENT.    Sincerely,  Marnell Mcdaniel, Louanne Belton., MD  Attending Emergency Physician  [facility] Emergency Department    ONSITE PHARMACY  Our full service onsite pharmacy is located in the ER waiting room.  Open 7 days a week from 9 am to 9 pm.  We accept all major insurances and prices are competitive with major  retailers.  Ask your provider to print your prescriptions down to the pharmacy to speed you on your way home.    OBTAINING A PRIMARY CARE APPOINTMENT    Primary care physicians (PCPs, also known as primary care doctors) are either internists or family medicine doctors. Both types of PCPs focus on health promotion, disease prevention, patient education and counseling, and treatment of acute and chronic medical conditions.    Call for an appointment with a primary care doctor.  Ask to see who is taking new patients.     [facility]  telephone:  [phone]  [email or web address]    DOCTOR REFERRALS  Call [phone] (available 24 hours a day, 7 days a week) if you need any further referrals and we can help you find a primary care doctor or specialist.  Also, available online at:  [email or web address]    YOUR CONTACT INFORMATION  Before leaving please check with registration to make sure we have an up-to-date contact number.  You can call registration at [phone] to update your information.  For questions about your hospital bill, please call [phone].  For questions about your Emergency Dept Physician bill please call [phone].      FREE HEALTH SERVICES  If you need help with health or social services, please call 2-1-1 for a free referral to resources in your area.  2-1-1 is a free service connecting people with information on health insurance, free clinics, pregnancy, mental health, dental care, food assistance, housing, and substance abuse counseling.  Also, available online at:  http://www.[place].org    MEDICAL RECORDS AND TESTS  Certain laboratory test results do not come back the same day, for example urine cultures.   We will contact you if other important findings are noted.  Radiology films are often reviewed again to ensure accuracy.  If there is any discrepancy, we will notify you.      Please call [phone] to pick up a complimentary CD of any radiology  studies performed.  If you or your doctor would like to request a copy of your medical records, please call [phone].      ORTHOPEDIC INJURY   Please know that significant injuries can exist even when an initial x-ray is read as normal or negative.  This can occur because some fractures (broken bones) are not initially visible on x-rays.  For this reason, close outpatient follow-up with your primary care doctor or bone specialist (orthopedist) is  required.    MEDICATIONS AND FOLLOWUP  Please be aware that some prescription medications can cause drowsiness.  Use caution when driving or operating machinery.    The examination and treatment you have received in our Emergency Department is provided on an emergency basis, and is not intended to be a substitute for your primary care physician.  It is important that your doctor checks you again and that you report any new or remaining problems at that time.      24 HOUR PHARMACIES  The nearest 24 hour pharmacy is:    [facility]  [address]  [place], [place] [address]  [phone]      ASSISTANCE WITH INSURANCE    Affordable Care Act  [facility])  Call to start or finish an application, compare plans, enroll or ask a question.  [phone]  TTY: [phone]  Web:  Healthcare.gov    Help Enrolling in [facility]  [employer] [place]  [phone] (TOLL-FREE)  [phone] (TTY)  Web:  Http://www.coverva.org    Local Help Enrolling in the [facility]  [place] [facility]  [phone] (MAIN)  Email:  health-help@nvfs .org  Web:  BlackjackMyths.is  Address:  [address] [place], [place] [address]    SEDATING MEDICATIONS  Sedating medications include strong pain medications (e.g. narcotics), muscle relaxers, benzodiazepines (used for anxiety and as muscle relaxers), Benadryl/diphenhydramine and other antihistamines for allergic reactions/itching, and other medications.  If you are unsure if you have received a sedating medication,  please ask your physician or nurse.  If you received a sedating medication: DO NOT drive a car. DO NOT operate machinery. DO NOT perform jobs where you need to be alert.  DO NOT drink alcoholic beverages while taking this medicine.     If you get dizzy, sit or lie down at the first signs. Be careful going up and down stairs.  Be extra careful to prevent falls.     Never give this medicine to others.     Keep this medicine out of reach of children.     Do not take or save old medicines. Throw them away when outdated.     Keep all medicines in a cool, dry place. DO NOT keep them in your bathroom medicine cabinet or in a cabinet above the stove.    MEDICATION REFILLS  Please be aware that we cannot refill any prescriptions through the ER. If you need further treatment from what is provided at your ER visit, please follow up with your primary care doctor or your pain management specialist.    FREESTANDING EMERGENCY DEPARTMENTS OF [facility]  Did you know [facility] has two freestanding ERs located just a few miles away?  [facility] and [facility] [place] have short wait times, easy free parking directly in front of the building and top patient satisfaction scores - and the same Board Certified Emergency Medicine doctors as [facility].

## 2019-01-19 NOTE — ED Provider Notes (Signed)
[facility] EMERGENCY DEPARTMENT H&P      Visit date: 01/19/2019      CLINICAL SUMMARY           Diagnosis:    .     Final diagnoses:   Chronic pain syndrome         MDM Notes:      Based on the patient's history and evaluation this appeared to be a chronic exacerbation of his chronic left arm pain.  PMP was reviewed, patient received one prescription for controlled substances in the last 2 years.  Records from [facility] were reviewed.  Patient has been seen multiple times for this issue and has had multiple evaluations by primary, orthopedic surgery.  Patient requested a steroid injection.  Appeared this was something orthopedic surgery I thought about doing but his A1c's were always too high.  Most recent A1c from [facility] was 10 about three months ago, on Sep 29, 2018.  Had normal renal function at that time with Cr 0.92.  Patient requested medication here, was given an injection of toradol.  Was placed on gabapentin and advised to try capsaicin cream.  Was advised about the mechanism of gabapentin and controlling neuropathic pain, specifically he has to take it every day.  Patient is being discharged in stable condition.         Disposition:         Discharge               Discharge Prescriptions     Medication Sig Dispense Auth. Provider    gabapentin (NEURONTIN) 300 MG capsule Take 1 capsule (300 mg total) by mouth daily for 1 day, THEN 1 capsule (300 mg total) 2 (two) times daily for 1 day, THEN 1 capsule (300 mg total) 3 (three) times daily for 12 days. 39 capsule Loras Grieshop, Louanne Belton., MD                         CLINICAL INFORMATION        HPI:      Chief Complaint: Arm Pain  .    Jeremy Hunt is a 63 y.o. male who presents with a 2-year history of pain in his left arm.  States he is seen multiple doctors for this, had multiple imaging, but has not gotten a diagnosis is why he is having the pain.  States it feels like there is a burning sensation over the outside aspect of his left  arm.  Denies any chest pain, exertional symptoms with this.  Typically uses over-the-counter Tylenol and Motrin, does not taken anything for the last 4 days.  Otherwise, patient is without complaint.    History obtained from: Patient          ROS:      Positive and negative ROS elements as per HPI.      Physical Exam:      Pulse 82   BP (!) 185/109   Resp 16   SpO2 98 %   Temp 97.9 F (36.6 C)    Constitutional: thin, healthy appearing male, sitting on a gurney, no acute distress  Head:  Normocephalic, atraumatic  Eyes: EOMI.  Sclera not injected, icteric  ENT: oral mucosa moist, nose midline  Neck: full range of motion, trachea midline.  No neck tenderness.  Respiratory/Chest: no respiratory distress, no stridor  Musculoskeletal: Mild tenderness palpation over the left deltoid.  Strength is 5/5 in the upper extremities bilaterally.  No  rashes noted.  No erythema, warmth.  Neurological: CN II-XII intact grossly. Moves all extremities symmetrically. No focal motor deficits by observation. Speech normal. Gait normal  Psychiatric: Normal affect. Normal concentration.             PAST HISTORY        Primary Care Provider: Patsy Lager, MD        PMH/PSH:    .     Past Medical History:   Diagnosis Date    Diabetes mellitus     Hypertension        He has no past surgical history on file.      Social/Family History:      He reports that he has never smoked. He has never used smokeless tobacco. He reports that he does not drink alcohol or use drugs.    History reviewed. No pertinent family history.      Listed Medications on Arrival:    .     Home Medications             amLODIPine (NORVASC) 10 MG tablet     Take 5 mg by mouth     aspirin EC 81 MG EC tablet     Take by mouth     lisinopril-hydroCHLOROthiazide (PRINZIDE,ZESTORETIC) 20-12.5 MG per tablet     Take 1 tablet by mouth     metoprolol tartrate (LOPRESSOR) 50 MG tablet     Take 50 mg by mouth         Allergies: He has No Known Allergies.            VISIT  INFORMATION        Clinical Course in the ED:                 Medications Given in the ED:    .     ED Medication Orders (From admission, onward)    Start Ordered     Status Ordering Provider    01/19/19 1156 01/19/19 1155  ketorolac (TORADOL) injection 15 mg  Once     Route: Intramuscular  Ordered Dose: 15 mg     Acknowledged Everette Dimauro, Lekeith  M JR.            Procedures:      Procedures      Interpretations:                     RESULTS        Lab Results:      Results     ** No results found for the last 24 hours. **              Radiology Results:      No orders to display               Scribe Attestation:      No scribe involved in the care of this patient          Parris Cudworth, Louanne Belton., MD  01/19/19 1241

## 2019-11-14 ENCOUNTER — Other Ambulatory Visit: Payer: Self-pay

## 2019-11-14 ENCOUNTER — Encounter (HOSPITAL_COMMUNITY): Payer: Self-pay

## 2019-11-14 ENCOUNTER — Emergency Department (HOSPITAL_COMMUNITY): Payer: Medicare Other

## 2019-11-14 ENCOUNTER — Emergency Department (HOSPITAL_COMMUNITY)
Admission: EM | Admit: 2019-11-14 | Discharge: 2019-11-14 | Disposition: A | Discharge status: Home / Self Care | Payer: Medicare Other | Attending: Emergency Medicine | Admitting: Emergency Medicine

## 2019-11-14 DIAGNOSIS — D696 Thrombocytopenia, unspecified: Secondary | ICD-10-CM | POA: Diagnosis not present

## 2019-11-14 DIAGNOSIS — M62830 Muscle spasm of back: Secondary | ICD-10-CM | POA: Insufficient documentation

## 2019-11-14 DIAGNOSIS — M546 Pain in thoracic spine: Secondary | ICD-10-CM | POA: Diagnosis present

## 2019-11-14 DIAGNOSIS — M62838 Other muscle spasm: Secondary | ICD-10-CM

## 2019-11-14 LAB — BASIC METABOLIC PANEL
Anion gap: 9 (ref 5–15)
BUN: 18 mg/dL (ref 8–23)
CO2: 25 mmol/L (ref 22–32)
Calcium: 9 mg/dL (ref 8.9–10.3)
Chloride: 103 mmol/L (ref 98–111)
Creatinine, Ser: 0.91 mg/dL (ref 0.61–1.24)
GFR calc Af Amer: >60 mL/min (ref >60)
GFR calc non Af Amer: >60 mL/min (ref >60)
Glucose, Bld: 368 mg/dL — ABNORMAL HIGH (ref 70–99)
Potassium: 4 mmol/L (ref 3.5–5.1)
Sodium: 137 mmol/L (ref 135–145)

## 2019-11-14 LAB — CBC WITH DIFFERENTIAL/PLATELET
Abs Immature Granulocytes: 0.03 10*3/uL (ref 0.00–0.07)
Basophils Absolute: 0 10*3/uL (ref 0.0–0.1)
Basophils Relative: 0 %
Eosinophils Absolute: 0.1 10*3/uL (ref 0.0–0.5)
Eosinophils Relative: 1 %
HCT: 38.2 % — ABNORMAL LOW (ref 39.0–52.0)
Hemoglobin: 12.8 g/dL — ABNORMAL LOW (ref 13.0–17.0)
Immature Granulocytes: 0 %
Lymphocytes Relative: 25 %
Lymphs Abs: 1.9 10*3/uL (ref 0.7–4.0)
MCH: 31.1 pg (ref 26.0–34.0)
MCHC: 33.5 g/dL (ref 30.0–36.0)
MCV: 92.7 fL (ref 80.0–100.0)
Monocytes Absolute: 1 10*3/uL (ref 0.1–1.0)
Monocytes Relative: 13 %
Neutro Abs: 4.5 10*3/uL (ref 1.7–7.7)
Neutrophils Relative %: 61 %
Platelets: 53 10*3/uL — ABNORMAL LOW (ref 150–400)
RBC: 4.12 MIL/uL — ABNORMAL LOW (ref 4.22–5.81)
RDW: 12.3 % (ref 11.5–15.5)
WBC: 7.5 10*3/uL (ref 4.0–10.5)
nRBC: 0 % (ref 0.0–0.2)

## 2019-11-14 LAB — URINALYSIS, ROUTINE W REFLEX MICROSCOPIC
Bacteria, UA: NONE SEEN
Bilirubin Urine: NEGATIVE
Glucose, UA: >=500 mg/dL — AB
Hgb urine dipstick: NEGATIVE
Ketones, ur: NEGATIVE mg/dL
Leukocytes,Ua: NEGATIVE
Nitrite: NEGATIVE
Protein, ur: NEGATIVE mg/dL
Specific Gravity, Urine: 1.03 (ref 1.005–1.030)
pH: 6 (ref 5.0–8.0)

## 2019-11-14 MED ORDER — CYCLOBENZAPRINE HCL 10 MG PO TABS: 10.0000 mg | ORAL_TABLET | Freq: Two times a day (BID) | ORAL | 0 refills | Status: AC | PRN

## 2019-11-14 MED ORDER — LIDOCAINE-EPINEPHRINE 2 %-1:100000 IJ SOLN
2.0000 mL | Freq: Once | INTRAMUSCULAR | Status: AC
  Administered 2019-11-14: 2 mL via INTRADERMAL
  Filled 2019-11-14: qty 1

## 2019-11-14 MED ORDER — KETOROLAC TROMETHAMINE 15 MG/ML IJ SOLN
15.0000 mg | Freq: Once | INTRAMUSCULAR | Status: AC
  Administered 2019-11-14: 15 mg via INTRAVENOUS
  Filled 2019-11-14: qty 1

## 2019-11-14 MED ORDER — NAPROXEN 500 MG PO TABS: 500.0000 mg | ORAL_TABLET | Freq: Two times a day (BID) | ORAL | 0 refills | Status: AC

## 2019-11-14 MED ORDER — CYCLOBENZAPRINE HCL 10 MG PO TABS
5.0000 mg | ORAL_TABLET | Freq: Once | ORAL | Status: AC
  Administered 2019-11-14: 5 mg via ORAL
  Filled 2019-11-14: qty 1

## 2019-11-14 NOTE — ED Provider Notes (Signed)
[facility] DEPT Provider Note   CSN: [id number] Arrival date & time: 11/14/19  1630     History Chief Complaint  Michael Gould presents with  . Flank Pain    Michael Gould is a 63 y.o. male.  Michael Gould is a 63 year old gentleman with no listed past medical history presenting to emergency department for left flank pain.  Michael Gould reports that the pain began yesterday.  Reports that it is worse with movement.  Reports that he thinks he has a problem with his kidneys or problems with his spine.  He denies any specific injury or trauma but reports that he has been working longer hours than normal.  Denies any hematuria, dysuria, decreased urination or difficulty urinating.  He denies any radiation into his legs, numbness, tingling, weakness.  Denies any fever, chills, nausea, vomiting.  He has not tried anything for relief.        History reviewed. No pertinent past medical history.  There are no problems to display for this Michael Gould.   History reviewed. No pertinent surgical history.     History reviewed. No pertinent family history.  Social History   Tobacco Use  . Smoking status: Not on file  Substance Use Topics  . Alcohol use: Not on file  . Drug use: Not on file    Home Medications Prior to Admission medications   Medication Sig Start Date End Date Taking? Authorizing Provider  cyclobenzaprine (FLEXERIL) 10 MG tablet Take 1 tablet (10 mg total) by mouth 2 (two) times daily as needed for up to 7 days for muscle spasms. 11/14/19 11/21/19  Madilyn Hook A, PA-C  naproxen (NAPROSYN) 500 MG tablet Take 1 tablet (500 mg total) by mouth 2 (two) times daily. 11/14/19   Alveria Apley, PA-C    Allergies    Michael Gould has no allergy information on record.  Review of Systems   Review of Systems  Constitutional: Negative for appetite change, chills and fever.  HENT: Negative for congestion and sore throat.   Respiratory: Negative for cough and  shortness of breath.   Cardiovascular: Negative for chest pain.  Gastrointestinal: Negative for abdominal pain, diarrhea, nausea and vomiting.  Genitourinary: Positive for flank pain. Negative for decreased urine volume, difficulty urinating, discharge, dysuria, frequency, hematuria, penile pain, penile swelling, scrotal swelling, testicular pain and urgency.  Skin: Negative for rash.  Neurological: Negative for dizziness, light-headedness and headaches.    Physical Exam Updated Vital Signs BP (!) 148/87   Pulse 82   Temp 98.2 F (36.8 C) (Oral)   Resp 19   Ht 5\' 9"  (1.753 m)   Wt 63.5 kg   SpO2 96%   BMI 20.67 kg/m   Physical Exam Vitals and nursing note reviewed.  Constitutional:      General: He is not in acute distress.    Appearance: Normal appearance. He is normal weight. He is not ill-appearing, toxic-appearing or diaphoretic.  HENT:     Head: Normocephalic.     Mouth/Throat:     Mouth: Mucous membranes are moist.  Eyes:     Conjunctiva/sclera: Conjunctivae normal.  Cardiovascular:     Rate and Rhythm: Normal rate and regular rhythm.  Pulmonary:     Effort: Pulmonary effort is normal.  Abdominal:     General: Abdomen is flat. There is no distension.     Tenderness: There is no abdominal tenderness. There is no right CVA tenderness, left CVA tenderness or guarding.  Musculoskeletal:     Comments: No  spinal bony tenderness.  He has musculoskeletal tenderness to palpation in the left thoracic muscles.  Skin:    General: Skin is dry.  Neurological:     Mental Status: He is alert.     Sensory: No sensory deficit.     Motor: No weakness.     Coordination: Coordination normal.     Deep Tendon Reflexes: Reflexes normal.  Psychiatric:        Mood and Affect: Mood normal.     ED Results / Procedures / Treatments   Labs (all labs ordered are listed, but only abnormal results are displayed) Labs Reviewed  BASIC METABOLIC PANEL - Abnormal; Notable for the following  components:      Result Value   Glucose, Bld 368 (*)    All other components within normal limits  CBC WITH DIFFERENTIAL/PLATELET - Abnormal; Notable for the following components:   RBC 4.12 (*)    Hemoglobin 12.8 (*)    HCT 38.2 (*)    Platelets 53 (*)    All other components within normal limits  URINALYSIS, ROUTINE W REFLEX MICROSCOPIC - Abnormal; Notable for the following components:   Color, Urine STRAW (*)    Glucose, UA >=500 (*)    All other components within normal limits    EKG None  Radiology DG Thoracic Spine 2 View  Result Date: 11/14/2019 CLINICAL DATA:  Neck pain.  No injury. EXAM: THORACIC SPINE 2 VIEWS COMPARISON:  None. FINDINGS: Degenerative disc disease seen in the lower thoracic and upper lumbar spine. No acute fractures are identified. No malalignment. IMPRESSION: Degenerative disc disease as above. Electronically Signed   By: Gerome Sam III M.D   On: 11/14/2019 18:38    Procedures .Nerve Block  Date/Time: 11/14/2019 7:52 PM Performed by: Arlyn Dunning, PA-C Authorized by: Arlyn Dunning, PA-C   Consent:    Consent obtained:  Verbal   Consent given by:  Michael Gould   Risks discussed:  Allergic reaction, nerve damage, pain and unsuccessful block Comments:     Trigger point injection to the left thoracolumbar region was performed by myself.  Michael Gould was cleansed in a sterile fashion.  A syringe of lidocaine with epi was drawn up.  27-gauge needle was used.  I palpated the area of a taut band in the Michael Gould's muscle between my fingers.  The needle was inserted at a 30 degree angle and into the muscle.  1 cc was injected.  The needle was then removed and the same process was repeated to adjacent trigger points in the left thoraco lumbar region.  Michael Gould tolerated well with good relief in his pain and spasms.    (including critical care time)  Medications Ordered in ED Medications  cyclobenzaprine (FLEXERIL) tablet 5 mg (5 mg Oral Given 11/14/19 1729)   ketorolac (TORADOL) 15 MG/ML injection 15 mg (15 mg Intravenous Given 11/14/19 1727)  lidocaine-EPINEPHrine (XYLOCAINE W/EPI) 2 %-1:100000 (with pres) injection 2 mL (2 mLs Intradermal Given by Other 11/14/19 1942)    ED Course  I have reviewed the triage vital signs and the nursing notes.  Pertinent labs & imaging results that were available during my care of the Michael Gould were reviewed by me and considered in my medical decision making (see chart for details).  Clinical Course as of Nov 13 1956  Sat Nov 14, 2019  1951 Michael Gould presenting with thoracic back pain since yesterday. Appears to be musculoskeletal. No concern for kidney stone. Point tender to palpation with spasms in the thoracic  muscles on the L so trigger point injection with lidocaine was performed with good results. Michael Gould sent home with muscle relaxant and f/u with PMD.   [KM]    Clinical Course User Index [KM] Jeral PinchMcLean, Marleen Moret A, PA-C   MDM Rules/Calculators/A&P                      Based on review of vitals, medical screening exam, lab work and/or imaging, there does not appear to be an acute, emergent etiology for the Michael Gould's symptoms. Counseled pt on good return precautions and encouraged both PCP and ED follow-up as needed.  Prior to discharge, I also discussed incidental imaging findings with Michael Gould in detail and advised appropriate, recommended follow-up in detail.  Clinical Impression: 1. Muscle spasm   2. Thrombocytopenia (HCC)     Disposition: Discharge  Prior to providing a prescription for a controlled substance, I independently reviewed the Michael Gould's recent prescription history on the [place] Controlled Substance Reporting System. The Michael Gould had no recent or regular prescriptions and was deemed appropriate for a brief, less than 3 day prescription of narcotic for acute analgesia.  This note was prepared with assistance of Conservation officer, historic buildingsDragon voice recognition software. Occasional wrong-word or sound-a-like  substitutions may have occurred due to the inherent limitations of voice recognition software.  Final Clinical Impression(s) / ED Diagnoses Final diagnoses:  Muscle spasm  Thrombocytopenia (HCC)    Rx / DC Orders ED Discharge Orders         Ordered    cyclobenzaprine (FLEXERIL) 10 MG tablet  2 times daily PRN     11/14/19 1957    naproxen (NAPROSYN) 500 MG tablet  2 times daily     11/14/19 1957           Jeral PinchMcLean, Shanetra Blumenstock A, PA-C 11/14/19 Antony Salmon1957    Plunkett, Whitney, MD 11/14/19 2342

## 2019-11-14 NOTE — ED Triage Notes (Signed)
Pt reports left flank pain that started yesterday. Pt denies any urinary issues or kidney problems.

## 2019-11-14 NOTE — Discharge Instructions (Signed)
You were seen today for Left side pain. Your kidney functio and urine were normal. You did have a high blood sugar level and low platelets. This needs to be followed up by your regular doctor soon. Your xray showed mild arthritis.  I think you are having pain and a muscle spasm in your back.  We injected the muscle with some numbing medicine to help.  We will also send you prescription for muscle relaxants and pain medication.  Be careful with his medications as they can cause sleepiness or drowsiness. Thank you for allowing me to care for you today. Please return to the emergency department if you have new or worsening symptoms. Take your medications as instructed.

## 2019-11-17 ENCOUNTER — Emergency Department (HOSPITAL_COMMUNITY)
Admission: EM | Admit: 2019-11-17 | Discharge: 2019-11-17 | Disposition: A | Discharge status: Home / Self Care | Payer: Medicare Other | Attending: Emergency Medicine | Admitting: Emergency Medicine

## 2019-11-17 ENCOUNTER — Encounter (HOSPITAL_COMMUNITY): Payer: Self-pay | Admitting: *Deleted

## 2019-11-17 ENCOUNTER — Other Ambulatory Visit: Payer: Self-pay

## 2019-11-17 ENCOUNTER — Emergency Department (HOSPITAL_COMMUNITY): Payer: Medicare Other

## 2019-11-17 DIAGNOSIS — E119 Type 2 diabetes mellitus without complications: Secondary | ICD-10-CM | POA: Insufficient documentation

## 2019-11-17 DIAGNOSIS — I161 Hypertensive emergency: Secondary | ICD-10-CM | POA: Diagnosis not present

## 2019-11-17 DIAGNOSIS — R1012 Left upper quadrant pain: Secondary | ICD-10-CM | POA: Insufficient documentation

## 2019-11-17 DIAGNOSIS — I160 Hypertensive urgency: Secondary | ICD-10-CM

## 2019-11-17 DIAGNOSIS — R1032 Left lower quadrant pain: Secondary | ICD-10-CM | POA: Insufficient documentation

## 2019-11-17 DIAGNOSIS — R109 Unspecified abdominal pain: Secondary | ICD-10-CM

## 2019-11-17 HISTORY — DX: Essential (primary) hypertension: I10

## 2019-11-17 HISTORY — DX: Type 2 diabetes mellitus without complications: E11.9

## 2019-11-17 LAB — URINALYSIS, ROUTINE W REFLEX MICROSCOPIC
Bilirubin Urine: NEGATIVE
Glucose, UA: >=500 mg/dL — AB
Hgb urine dipstick: NEGATIVE
Ketones, ur: NEGATIVE mg/dL
Leukocytes,Ua: NEGATIVE
Nitrite: NEGATIVE
Protein, ur: NEGATIVE mg/dL
Specific Gravity, Urine: >1.046 — ABNORMAL HIGH (ref 1.005–1.030)
pH: 5 (ref 5.0–8.0)

## 2019-11-17 LAB — CBC
HCT: 49.3 % (ref 39.0–52.0)
Hemoglobin: 16.6 g/dL (ref 13.0–17.0)
MCH: 30.7 pg (ref 26.0–34.0)
MCHC: 33.7 g/dL (ref 30.0–36.0)
MCV: 91.1 fL (ref 80.0–100.0)
Platelets: DECREASED 10*3/uL (ref 150–400)
RBC: 5.41 MIL/uL (ref 4.22–5.81)
RDW: 12.4 % (ref 11.5–15.5)
WBC: 8.8 10*3/uL (ref 4.0–10.5)
nRBC: 0 % (ref 0.0–0.2)

## 2019-11-17 LAB — COMPREHENSIVE METABOLIC PANEL
ALT: 24 U/L (ref 0–44)
AST: 34 U/L (ref 15–41)
Albumin: 4.7 g/dL (ref 3.5–5.0)
Alkaline Phosphatase: 67 U/L (ref 38–126)
Anion gap: 10 (ref 5–15)
BUN: 21 mg/dL (ref 8–23)
CO2: 27 mmol/L (ref 22–32)
Calcium: 9.6 mg/dL (ref 8.9–10.3)
Chloride: 98 mmol/L (ref 98–111)
Creatinine, Ser: 0.77 mg/dL (ref 0.61–1.24)
GFR calc Af Amer: >60 mL/min (ref >60)
GFR calc non Af Amer: >60 mL/min (ref >60)
Glucose, Bld: 205 mg/dL — ABNORMAL HIGH (ref 70–99)
Potassium: 5.3 mmol/L — ABNORMAL HIGH (ref 3.5–5.1)
Sodium: 135 mmol/L (ref 135–145)
Total Bilirubin: 1.4 mg/dL — ABNORMAL HIGH (ref 0.3–1.2)
Total Protein: 8.9 g/dL — ABNORMAL HIGH (ref 6.5–8.1)

## 2019-11-17 LAB — POTASSIUM: Potassium: 4.6 mmol/L (ref 3.5–5.1)

## 2019-11-17 LAB — LIPASE, BLOOD: Lipase: 28 U/L (ref 11–51)

## 2019-11-17 LAB — TROPONIN I (HIGH SENSITIVITY): Troponin I (High Sensitivity): 13 ng/L (ref <18)

## 2019-11-17 LAB — D-DIMER, QUANTITATIVE: D-Dimer, Quant: 0.91 ug/mL-FEU — ABNORMAL HIGH (ref 0.00–0.50)

## 2019-11-17 MED ORDER — LISINOPRIL 10 MG PO TABS
10.0000 mg | ORAL_TABLET | Freq: Once | ORAL | Status: AC
  Administered 2019-11-17: 10 mg via ORAL
  Filled 2019-11-17: qty 1

## 2019-11-17 MED ORDER — IOHEXOL 350 MG/ML SOLN
100.0000 mL | Freq: Once | INTRAVENOUS | Status: AC | PRN
  Administered 2019-11-17: 11:00:00 100 mL via INTRAVENOUS

## 2019-11-17 MED ORDER — METOPROLOL TARTRATE 25 MG PO TABS
50.0000 mg | ORAL_TABLET | Freq: Once | ORAL | Status: AC
  Administered 2019-11-17: 50 mg via ORAL
  Filled 2019-11-17: qty 2

## 2019-11-17 MED ORDER — MORPHINE SULFATE (PF) 4 MG/ML IV SOLN
4.0000 mg | Freq: Once | INTRAVENOUS | Status: AC
  Administered 2019-11-17: 10:00:00 4 mg via INTRAVENOUS
  Filled 2019-11-17: qty 1

## 2019-11-17 MED ORDER — AMLODIPINE BESYLATE 5 MG PO TABS
5.0000 mg | ORAL_TABLET | Freq: Once | ORAL | Status: AC
  Administered 2019-11-17: 14:00:00 5 mg via ORAL
  Filled 2019-11-17: qty 1

## 2019-11-17 MED ORDER — SODIUM CHLORIDE 0.9% FLUSH
3.0000 mL | Freq: Once | INTRAVENOUS | Status: AC
  Administered 2019-11-17: 3 mL via INTRAVENOUS

## 2019-11-17 MED ORDER — ONDANSETRON HCL 4 MG/2ML IJ SOLN
4.0000 mg | Freq: Once | INTRAMUSCULAR | Status: AC
  Administered 2019-11-17: 10:00:00 4 mg via INTRAVENOUS
  Filled 2019-11-17: qty 2

## 2019-11-17 MED ORDER — HYDROCHLOROTHIAZIDE 12.5 MG PO CAPS
25.0000 mg | ORAL_CAPSULE | Freq: Once | ORAL | Status: AC
  Administered 2019-11-17: 25 mg via ORAL
  Filled 2019-11-17: qty 2

## 2019-11-17 MED ORDER — KETOROLAC TROMETHAMINE 30 MG/ML IJ SOLN
15.0000 mg | Freq: Once | INTRAMUSCULAR | Status: AC
  Administered 2019-11-17: 13:00:00 15 mg via INTRAVENOUS
  Filled 2019-11-17: qty 1

## 2019-11-17 MED ORDER — SODIUM CHLORIDE (PF) 0.9 % IJ SOLN
INTRAMUSCULAR | Status: AC
  Filled 2019-11-17: qty 50

## 2019-11-17 MED ORDER — SODIUM CHLORIDE 0.9 % IV BOLUS
1000.0000 mL | Freq: Once | INTRAVENOUS | Status: AC
  Administered 2019-11-17: 10:00:00 1000 mL via INTRAVENOUS

## 2019-11-17 NOTE — ED Notes (Signed)
Pt given water for PO fluid challenge

## 2019-11-17 NOTE — Discharge Instructions (Signed)
As we discussed you may have passed a kidney stone.  You are leaving [facility] because all of your blood work is not resulted.  You may check the results online later.  Establish care with a doctor and return to the ED if you wish to be reevaluated.

## 2019-11-17 NOTE — ED Triage Notes (Signed)
Reports left flank pain with nausea and diarrhea, was seen last week for left flank pain. Wants to be admitted due to flank pain

## 2019-11-17 NOTE — ED Notes (Signed)
Pt states he is unable to void at this time. NT encouraged pt to try. Pt aware a urine sample is needed and urinal is at bedside.

## 2019-11-17 NOTE — ED Provider Notes (Signed)
[facility] DEPT Provider Note   CSN: [id number] Arrival date & time: 11/17/19  0865     History Chief Complaint  Patient presents with  . Flank Pain  . Nausea  . Diarrhea    Michael Gould. is a 63 y.o. male.  Patient with history of hypertension and diabetes returns with left-sided flank pain.  He was seen 3 days ago and thought to have a muscle spasm.  He received a trigger point injection and was discharged.  He states the pain quickly returned and is getting worse.  He has not been taking thing for it at home.  He states most the pain is in his left lower back radiates to his left abdomen.  This morning he developed several episodes of nausea and vomiting as well as diarrhea.  States he vomited about 3 times.  Multiple episodes of nonbloody stools.  No fever.  No pain with urination or blood in the urine.  No chest pain or shortness of breath but he does feel like the pain "takes my breath away".  No injury or trauma.  The pain was initially worse with movement.  No bowel or bladder incontinence.  No radiation of the pain into his legs.  No focal weakness, numbness or tingling.  No testicular pain.  The history is provided by the patient.  Flank Pain Associated symptoms include abdominal pain. Pertinent negatives include no chest pain, no headaches and no shortness of breath.  Diarrhea Associated symptoms: abdominal pain and vomiting   Associated symptoms: no fever and no headaches        Past Medical History:  Diagnosis Date  . Diabetes mellitus without complication ([place])   . Hypertension     There are no problems to display for this patient.   History reviewed. No pertinent surgical history.     No family history on file.  Social History   Tobacco Use  . Smoking status: Never Smoker  . Smokeless tobacco: Never Used  Substance Use Topics  . Alcohol use: Never  . Drug use: Never    Home Medications Prior to Admission  medications   Medication Sig Start Date End Date Taking? Authorizing Provider  cyclobenzaprine (FLEXERIL) 10 MG tablet Take 1 tablet (10 mg total) by mouth 2 (two) times daily as needed for up to 7 days for muscle spasms. 11/14/19 11/21/19  Madilyn Hook A, PA-C  naproxen (NAPROSYN) 500 MG tablet Take 1 tablet (500 mg total) by mouth 2 (two) times daily. 11/14/19   Alveria Apley, PA-C    Allergies    Patient has no known allergies.  Review of Systems   Review of Systems  Constitutional: Negative for activity change, appetite change and fever.  HENT: Negative for congestion and rhinorrhea.   Respiratory: Negative for cough, chest tightness and shortness of breath.   Cardiovascular: Negative for chest pain and leg swelling.  Gastrointestinal: Positive for abdominal pain, diarrhea, nausea and vomiting.  Genitourinary: Positive for flank pain. Negative for dysuria, hematuria and testicular pain.  Musculoskeletal: Positive for back pain.  Skin: Negative for rash.  Neurological: Negative for dizziness, weakness and headaches.   all other systems are negative except as noted in the HPI and PMH.    Physical Exam Updated Vital Signs BP (!) 174/103 (BP Location: Right Arm) Comment: RN aware  Pulse 92   Temp 98.4 F (36.9 C) (Oral)   Resp 16   Wt 63.5 kg   SpO2 98%  BMI 20.67 kg/m   Physical Exam Vitals and nursing note reviewed.  Constitutional:      General: He is not in acute distress.    Appearance: He is well-developed.     Comments: uncomfortable  HENT:     Head: Normocephalic and atraumatic.     Mouth/Throat:     Pharynx: No oropharyngeal exudate.  Eyes:     Conjunctiva/sclera: Conjunctivae normal.     Pupils: Pupils are equal, round, and reactive to light.  Neck:     Comments: No meningismus. Cardiovascular:     Rate and Rhythm: Normal rate and regular rhythm.     Heart sounds: Normal heart sounds. No murmur.  Pulmonary:     Effort: Pulmonary effort is normal. No  respiratory distress.     Breath sounds: Normal breath sounds.  Abdominal:     Palpations: Abdomen is soft.     Tenderness: There is abdominal tenderness. There is no guarding or rebound.     Comments: TTP LUQ and LLQ.  No guarding or rebound  Genitourinary:    Comments: Penile prosthesis present.  No testicular pain.   L lateral hip with soft tissue mass, nontender Musculoskeletal:        General: Tenderness present. Normal range of motion.     Cervical back: Normal range of motion and neck supple.     Comments: L paraspinal lumbar tenderness. No midline pain. Intact femoral, DP, and PT pulses.  Skin:    General: Skin is warm.     Capillary Refill: Capillary refill takes less than 2 seconds.  Neurological:     General: No focal deficit present.     Mental Status: He is alert and oriented to person, place, and time. Mental status is at baseline.     Cranial Nerves: No cranial nerve deficit.     Motor: No abnormal muscle tone.     Coordination: Coordination normal.     Comments:  5/5 strength throughout. CN 2-12 intact.Equal grip strength.   Psychiatric:        Behavior: Behavior normal.     ED Results / Procedures / Treatments   Labs (all labs ordered are listed, but only abnormal results are displayed) Labs Reviewed  URINALYSIS, ROUTINE W REFLEX MICROSCOPIC - Abnormal; Notable for the following components:      Result Value   Specific Gravity, Urine >1.046 (*)    Glucose, UA >=500 (*)    Bacteria, UA RARE (*)    All other components within normal limits  COMPREHENSIVE METABOLIC PANEL - Abnormal; Notable for the following components:   Potassium 5.3 (*)    Glucose, Bld 205 (*)    Total Protein 8.9 (*)    Total Bilirubin 1.4 (*)    All other components within normal limits  D-DIMER, QUANTITATIVE (NOT AT [facility]) - Abnormal; Notable for the following components:   D-Dimer, Quant 0.91 (*)    All other components within normal limits  LIPASE, BLOOD  CBC  POTASSIUM    TROPONIN I (HIGH SENSITIVITY)  TROPONIN I (HIGH SENSITIVITY)    EKG EKG Interpretation  Date/Time:  Tuesday November 17 2019 13:10:56 EST Ventricular Rate:  90 PR Interval:    QRS Duration: 96 QT Interval:  357 QTC Calculation: 437 R Axis:   58 Text Interpretation: Sinus rhythm Prolonged PR interval Abnormal T, consider ischemia, diffuse leads No previous ECGs available Confirmed by Glynn Octave [phone]) on 11/17/2019 1:14:55 PM   Radiology CT Angio Chest PE W and/or Wo  Contrast  Result Date: 11/17/2019 CLINICAL DATA:  Left flank pain. Nausea. Diarrhea. Elevated D-dimer. EXAM: CT ANGIOGRAPHY CHEST CT ABDOMEN AND PELVIS WITHOUT CONTRAST TECHNIQUE: Multidetector CT imaging of the chest was performed using the standard protocol during bolus administration of intravenous contrast. Multiplanar CT image reconstructions and MIPs were obtained to evaluate the vascular anatomy. Multidetector CT imaging of the abdomen and pelvis was performed following the standard protocol without IV contrast. CONTRAST:  OMNIPAQUE IOHEXOL 350 MG/ML SOLN COMPARISON:  None. FINDINGS: CTA CHEST FINDINGS Cardiovascular: The study is high quality for the evaluation of pulmonary embolism. There are no filling defects in the central, lobar, segmental or subsegmental pulmonary artery branches to suggest acute pulmonary embolism. Mildly atherosclerotic nonaneurysmal thoracic aorta. Top-normal caliber main pulmonary artery (3.4 cm diameter). Top-normal heart size. No significant pericardial fluid/thickening. Left anterior descending coronary atherosclerosis. Mediastinum/Nodes: No discrete thyroid nodules. Unremarkable esophagus. No pathologically enlarged axillary, mediastinal or hilar lymph nodes. Coarsely calcified nonenlarged AP window, bilateral paratracheal, subcarinal and bilateral hilar lymph nodes from prior granulomatous disease. Lungs/Pleura: No pneumothorax. No pleural effusion. No acute consolidative  airspace disease, lung masses or significant pulmonary nodules. Numerous thin parenchymal bands scattered throughout both lungs, predominantly in the posterior mid lungs. Musculoskeletal: No aggressive appearing focal osseous lesions. Mild thoracic spondylosis. Review of the MIP images confirms the above findings. CT ABDOMEN and PELVIS FINDINGS Hepatobiliary: Normal liver size. No liver mass. Normal gallbladder with no radiopaque cholelithiasis. No biliary ductal dilatation. Pancreas: Normal, with no mass or duct dilation. Spleen: Normal size. No mass. Adrenals/Urinary Tract: Normal adrenals. No hydronephrosis. Nonobstructing 3 mm upper, 2 mm interpolar and 3 mm lower left renal stones. No right renal stones. Simple 2.5 cm anterior interpolar right renal cyst. No additional contour deforming renal lesions. Normal caliber ureters. No ureteral stones. Normal bladder. Stomach/Bowel: Normal non-distended stomach. Normal caliber small bowel with no small bowel wall thickening. Normal appendix. Normal large bowel with no diverticulosis, large bowel wall thickening or pericolonic fat stranding. Vascular/Lymphatic: Atherosclerotic nonaneurysmal abdominal aorta. No pathologically enlarged lymph nodes in the abdomen or pelvis. Reproductive: Top-normal size prostate. Partially visualized intact appearing penile prosthesis with minimally distended reservoir in the anterior right pelvis. Other: No pneumoperitoneum, ascites or focal fluid collection. Musculoskeletal: No aggressive appearing focal osseous lesions. Small sclerotic focus in the medial right iliac bone, nonspecific, favor degenerative or bone island. Mild lumbar spondylosis. IMPRESSION: 1. No pulmonary embolism. 2. One vessel coronary atherosclerosis. 3. No acute pulmonary disease. Thin scattered parenchymal bands predominantly in the mid lungs, compatible with nonspecific postinfectious/postinflammatory scarring. Calcified mediastinal and bilateral hilar lymph nodes  compatible with history of granulomatous disease. 4. No acute abnormality in the abdomen or pelvis. Nonobstructing left nephrolithiasis. No hydronephrosis. No ureteral or bladder stones. 5.  Aortic Atherosclerosis (ICD10-I70.0). Electronically Signed   By: Delbert Phenix M.D.   On: 11/17/2019 11:55   CT Renal Stone Study  Result Date: 11/17/2019 CLINICAL DATA:  Left flank pain. Nausea. Diarrhea. Elevated D-dimer. EXAM: CT ANGIOGRAPHY CHEST CT ABDOMEN AND PELVIS WITHOUT CONTRAST TECHNIQUE: Multidetector CT imaging of the chest was performed using the standard protocol during bolus administration of intravenous contrast. Multiplanar CT image reconstructions and MIPs were obtained to evaluate the vascular anatomy. Multidetector CT imaging of the abdomen and pelvis was performed following the standard protocol without IV contrast. CONTRAST:  OMNIPAQUE IOHEXOL 350 MG/ML SOLN COMPARISON:  None. FINDINGS: CTA CHEST FINDINGS Cardiovascular: The study is high quality for the evaluation of pulmonary embolism. There are no filling defects  in the central, lobar, segmental or subsegmental pulmonary artery branches to suggest acute pulmonary embolism. Mildly atherosclerotic nonaneurysmal thoracic aorta. Top-normal caliber main pulmonary artery (3.4 cm diameter). Top-normal heart size. No significant pericardial fluid/thickening. Left anterior descending coronary atherosclerosis. Mediastinum/Nodes: No discrete thyroid nodules. Unremarkable esophagus. No pathologically enlarged axillary, mediastinal or hilar lymph nodes. Coarsely calcified nonenlarged AP window, bilateral paratracheal, subcarinal and bilateral hilar lymph nodes from prior granulomatous disease. Lungs/Pleura: No pneumothorax. No pleural effusion. No acute consolidative airspace disease, lung masses or significant pulmonary nodules. Numerous thin parenchymal bands scattered throughout both lungs, predominantly in the posterior mid lungs. Musculoskeletal: No  aggressive appearing focal osseous lesions. Mild thoracic spondylosis. Review of the MIP images confirms the above findings. CT ABDOMEN and PELVIS FINDINGS Hepatobiliary: Normal liver size. No liver mass. Normal gallbladder with no radiopaque cholelithiasis. No biliary ductal dilatation. Pancreas: Normal, with no mass or duct dilation. Spleen: Normal size. No mass. Adrenals/Urinary Tract: Normal adrenals. No hydronephrosis. Nonobstructing 3 mm upper, 2 mm interpolar and 3 mm lower left renal stones. No right renal stones. Simple 2.5 cm anterior interpolar right renal cyst. No additional contour deforming renal lesions. Normal caliber ureters. No ureteral stones. Normal bladder. Stomach/Bowel: Normal non-distended stomach. Normal caliber small bowel with no small bowel wall thickening. Normal appendix. Normal large bowel with no diverticulosis, large bowel wall thickening or pericolonic fat stranding. Vascular/Lymphatic: Atherosclerotic nonaneurysmal abdominal aorta. No pathologically enlarged lymph nodes in the abdomen or pelvis. Reproductive: Top-normal size prostate. Partially visualized intact appearing penile prosthesis with minimally distended reservoir in the anterior right pelvis. Other: No pneumoperitoneum, ascites or focal fluid collection. Musculoskeletal: No aggressive appearing focal osseous lesions. Small sclerotic focus in the medial right iliac bone, nonspecific, favor degenerative or bone island. Mild lumbar spondylosis. IMPRESSION: 1. No pulmonary embolism. 2. One vessel coronary atherosclerosis. 3. No acute pulmonary disease. Thin scattered parenchymal bands predominantly in the mid lungs, compatible with nonspecific postinfectious/postinflammatory scarring. Calcified mediastinal and bilateral hilar lymph nodes compatible with history of granulomatous disease. 4. No acute abnormality in the abdomen or pelvis. Nonobstructing left nephrolithiasis. No hydronephrosis. No ureteral or bladder stones. 5.   Aortic Atherosclerosis (ICD10-I70.0). Electronically Signed   By: Delbert PhenixJason A Poff M.D.   On: 11/17/2019 11:55    Procedures Procedures (including critical care time)  Medications Ordered in ED Medications  sodium chloride 0.9 % bolus 1,000 mL (has no administration in time range)  ondansetron (ZOFRAN) injection 4 mg (has no administration in time range)  morphine 4 MG/ML injection 4 mg (has no administration in time range)  sodium chloride flush (NS) 0.9 % injection 3 mL (3 mLs Intravenous Given 11/17/19 [id number])    ED Course  I have reviewed the triage vital signs and the nursing notes.  Pertinent labs & imaging results that were available during my care of the patient were reviewed by me and considered in my medical decision making (see chart for details).    MDM Rules/Calculators/A&P                      Patient returns with left flank pain now with nausea, vomiting and diarrhea.  No urinary symptoms.  His pain seems more lumbar than thoracic today.  His urinalysis 3 days ago did not show any hematuria.  Urinalysis today is negative.  Labs are reassuring.  With persistent pain will be imaged to rule out kidney stone.  CT shows nephrolithiasis but no ureterolithiasis. No evidence of pneumonia or pulmonary embolism.  Patient's pain  is resolved with treatment in the ED.  Discussed with him that it may be secondary to passed kidney stone.  He is hypertensive and will be given his home blood pressure medication today.  Patient tolerating p.o. without difficulty.  Blood pressure medications. Potassium slightly elevated but also likely hemolyzed.  Repeat potassium in process.  Patient states his ride is leaving he cannot wait for results. Patient states results online later.  Discussed the need to leave AGAINST MEDICAL ADVICE because we do not have the results of the repeat potassium or troponin.  Discussed with him not elevated potassium can be life-threatening. He states he has to leave  now because his ride is leaving.  He appears to have capacity to make this decision.   Addendum: Patient did elect to stay to receive the results of his repeat potassium level which was normal.  Troponin is negative. Final Clinical Impression(s) / ED Diagnoses Final diagnoses:  Flank pain  Hypertensive urgency    Rx / DC Orders ED Discharge Orders    None       Chyna Kneece, Jeannett Senior, MD 11/17/19 1552

## 2019-11-17 NOTE — ED Notes (Signed)
Patient transported to CT 

## 2019-12-26 ENCOUNTER — Encounter: Payer: Self-pay | Admitting: Intensive Care

## 2019-12-26 ENCOUNTER — Emergency Department
Admission: EM | Admit: 2019-12-26 | Discharge: 2019-12-26 | Disposition: A | Discharge status: Home / Self Care | Payer: Medicare Other | Attending: Student in an Organized Health Care Education/Training Program | Admitting: Student in an Organized Health Care Education/Training Program

## 2019-12-26 ENCOUNTER — Other Ambulatory Visit: Payer: Self-pay

## 2019-12-26 DIAGNOSIS — Z7984 Long term (current) use of oral hypoglycemic drugs: Secondary | ICD-10-CM | POA: Diagnosis not present

## 2019-12-26 DIAGNOSIS — E119 Type 2 diabetes mellitus without complications: Secondary | ICD-10-CM | POA: Diagnosis not present

## 2019-12-26 DIAGNOSIS — Y69 Unspecified misadventure during surgical and medical care: Secondary | ICD-10-CM | POA: Insufficient documentation

## 2019-12-26 DIAGNOSIS — N529 Male erectile dysfunction, unspecified: Secondary | ICD-10-CM | POA: Diagnosis not present

## 2019-12-26 DIAGNOSIS — T83490A Other mechanical complication of penile (implanted) prosthesis, initial encounter: Secondary | ICD-10-CM | POA: Diagnosis present

## 2019-12-26 DIAGNOSIS — Z7982 Long term (current) use of aspirin: Secondary | ICD-10-CM | POA: Diagnosis not present

## 2019-12-26 DIAGNOSIS — Z79899 Other long term (current) drug therapy: Secondary | ICD-10-CM | POA: Diagnosis not present

## 2019-12-26 DIAGNOSIS — I10 Essential (primary) hypertension: Secondary | ICD-10-CM | POA: Diagnosis not present

## 2019-12-26 NOTE — ED Notes (Signed)
Patient to ed with concerns not being able to pump up his penile erection pump. Reports he does not think it was working called his urologist and was advised to come to ed. Patient evaluated by ER physician, patient pumped his hardware which appears to be working md asked patient to deflate, patient was able to deflate successfully patient discharged with follow up instructions to his urologist with further concerns of hard ware malfunction.

## 2019-12-26 NOTE — ED Triage Notes (Signed)
First RN Note: Pt presents to ED via POV, states was referred due to having problems with his penile implant to treat erectile dysfunction.

## 2019-12-26 NOTE — ED Provider Notes (Signed)
[facility] Emergency Department Provider Note    First MD Initiated Contact with Patient 12/26/19 1243     (approximate)  I have reviewed the triage vital signs and the nursing notes.   HISTORY  Chief Complaint Post-op Problem    HPI Michael Gould. is a 64 y.o. male with a history of ED status post penile prosthesis implant presents the ER to check to make sure that his implant is working.  States that it was taking longer to pump up and he called his urologist who sent him to the ER.  Denies any fevers.  No dysuria.  No pain.  Is not having trouble deflating it.  Denies any other discomfort or concerns.  Past Medical History:  Diagnosis Date  . Diabetes mellitus without complication ([facility])   . Hypertension    History reviewed. No pertinent family history. Past Surgical History:  Procedure Laterality Date  . PENILE PROSTHESIS IMPLANT     There are no problems to display for this patient.     Prior to Admission medications   Medication Sig Start Date End Date Taking? Authorizing Provider  albuterol (VENTOLIN HFA) 108 (90 Base) MCG/ACT inhaler Inhale 2 puffs into the lungs every 4 (four) hours as needed for shortness of breath or wheezing. 10/02/17   [provider]  amLODipine (NORVASC) 10 MG tablet Take 10 mg by mouth daily. 09/30/18   [provider]  aspirin 81 MG EC tablet Take 81 mg by mouth daily. 04/03/16   [provider]  glipiZIDE (GLUCOTROL) 10 MG tablet Take 10 mg by mouth 2 (two) times daily. 09/02/17   [provider]  hydrochlorothiazide (HYDRODIURIL) 12.5 MG tablet Take 12.5 mg by mouth daily. 09/25/19   [provider]  lisinopril (ZESTRIL) 10 MG tablet Take 10 mg by mouth daily. 09/25/19   [provider]  LORazepam (ATIVAN) 1 MG tablet Take 1 mg by mouth daily as needed for anxiety. 06/16/18   [provider]  metFORMIN (GLUCOPHAGE) 500 MG tablet Take 500 mg by mouth 2  (two) times daily. 09/25/19   [provider]  metoprolol tartrate (LOPRESSOR) 50 MG tablet Take 50 mg by mouth daily. 09/30/18   [provider]  naproxen (NAPROSYN) 500 MG tablet Take 1 tablet (500 mg total) by mouth 2 (two) times daily. 11/14/19   Arlyn Dunning, PA-C    Allergies Patient has no known allergies.    Social History Social History   Tobacco Use  . Smoking status: Never Smoker  . Smokeless tobacco: Never Used  Substance Use Topics  . Alcohol use: Yes    Comment: rare  . Drug use: Never    Review of Systems Patient denies headaches, rhinorrhea, blurry vision, numbness, shortness of breath, chest pain, edema, cough, abdominal pain, nausea, vomiting, diarrhea, dysuria, fevers, rashes or hallucinations unless otherwise stated above in HPI. ____________________________________________   PHYSICAL EXAM:  VITAL SIGNS: Vitals:   12/26/19 1238 12/26/19 1245  BP: (!) 146/100   Pulse:    Resp:    Temp:    SpO2:  99%    Constitutional: Alert and oriented. Well appearing and in no acute distress. Eyes: Conjunctivae are normal.  Head: Atraumatic. Nose: No congestion/rhinnorhea. Mouth/Throat: Mucous membranes are moist.   Neck: Painless ROM.  Cardiovascular:   Good peripheral circulation. Respiratory: Normal respiratory effort.  No retractions.  Gastrointestinal: Soft and nontender.  GU: No erythema swelling.  Penile prosthesis appears to be appropriately functioning  and will appropriately detumesce Musculoskeletal: No lower extremity tenderness .  No joint effusions. Neurologic:  Normal speech and language. No gross focal neurologic deficits are appreciated.  Skin:  Skin is warm, dry and intact. No rash noted. Psychiatric: Mood and affect are normal. Speech and behavior are normal.  ____________________________________________   LABS (all labs ordered are listed, but only abnormal results are displayed)  No results found for this or any  previous visit (from the past 24 hour(s)). ____________________________________________ _______________________________  [id number]   ____________________________________________   PROCEDURES  Procedure(s) performed:  Procedures    Critical Care performed: no ____________________________________________   INITIAL IMPRESSION / ASSESSMENT AND PLAN / ED COURSE  Pertinent labs & imaging results that were available during my care of the patient were reviewed by me and considered in my medical decision making (see chart for details).  DDX: priapism, malfunctioning prosthesis, well check  Michael Gould. is a 64 y.o. who presents to the ED with symptoms as described above.  Patient well-appearing in no acute distress.  No evidence of emergent process at this time.  No evidence of priapism prosthesis appears to be appropriately functioning.  Patient to follow up with his urologist.      ____________________________________________   FINAL CLINICAL IMPRESSION(S) / ED DIAGNOSES  Final diagnoses:  Erectile dysfunction, unspecified erectile dysfunction type      NEW MEDICATIONS STARTED DURING THIS VISIT:  New Prescriptions   No medications on file     Note:  This document was prepared using Dragon voice recognition software and may include unintentional dictation errors.     Merlyn Lot, MD 12/26/19 1259

## 2019-12-26 NOTE — ED Triage Notes (Signed)
Patient reports he is having trouble pumping up his penile implant. His doctor sent him here

## 2021-05-20 IMAGING — CT CT ANGIO CHEST
2 of 6 series · 17 of 46 positions shown · IV contrast (OMNIPAQUE 350)
Comparison: None.

CLINICAL DATA: Left flank pain. Nausea. Diarrhea. Elevated D-dimer.

EXAM:
CT ANGIOGRAPHY CHEST
CT ABDOMEN AND PELVIS WITHOUT CONTRAST
TECHNIQUE: Multidetector CT imaging of the chest was performed using the
standard protocol during bolus administration of intravenous
contrast. Multiplanar CT image reconstructions and MIPs were
obtained to evaluate the vascular anatomy. Multidetector CT imaging
of the abdomen and pelvis was performed following the standard
protocol without IV contrast.
CONTRAST:  100mL OMNIPAQUE IOHEXOL 350 MG/ML SOLN

[Series 3: thins · axial · 0.74mm/px · z∈[-293,-39]mm · 14 of 284 slices shown]
[im 15/284  lung]
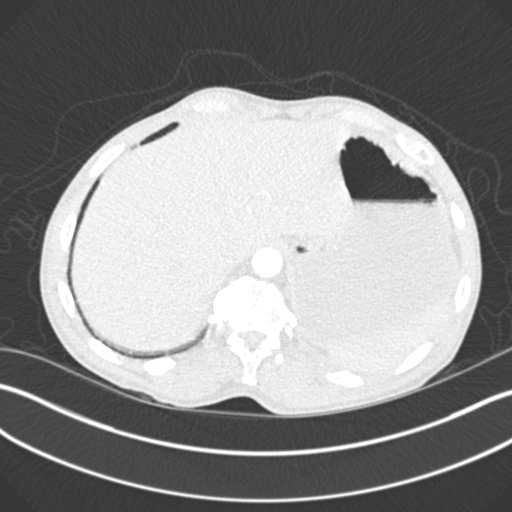
[im 43/284  soft-tissue]
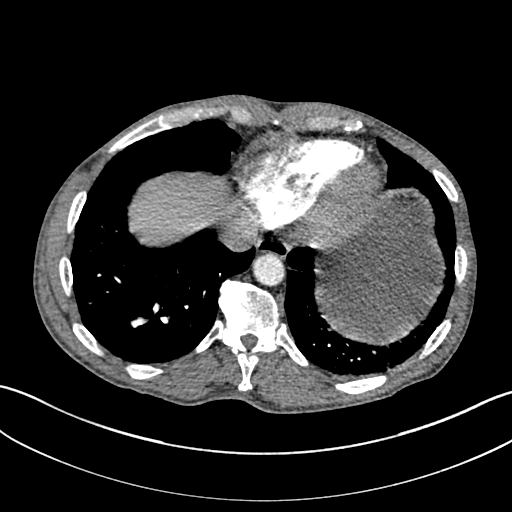
[im 57/284  lung]
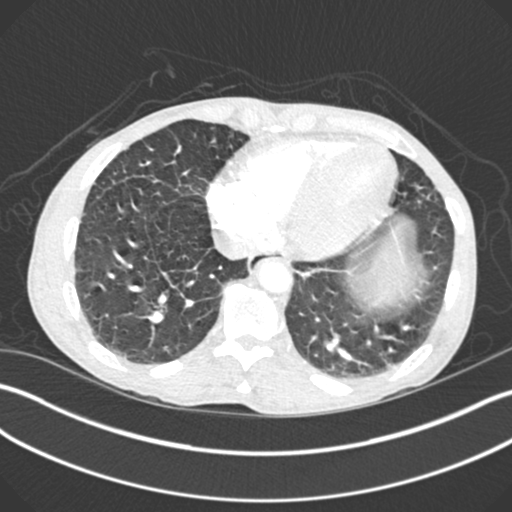
[im 71/284  soft-tissue]
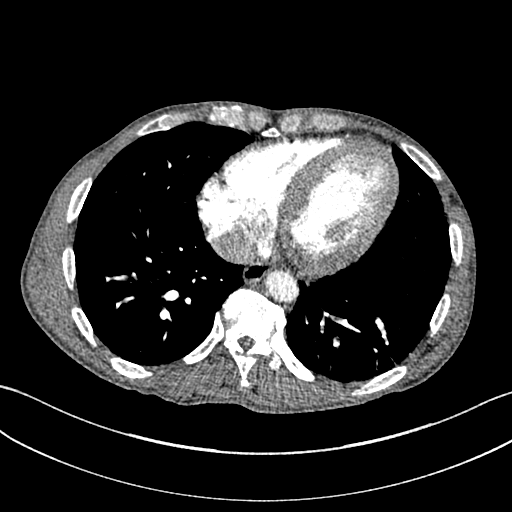
[im 100/284  lung]
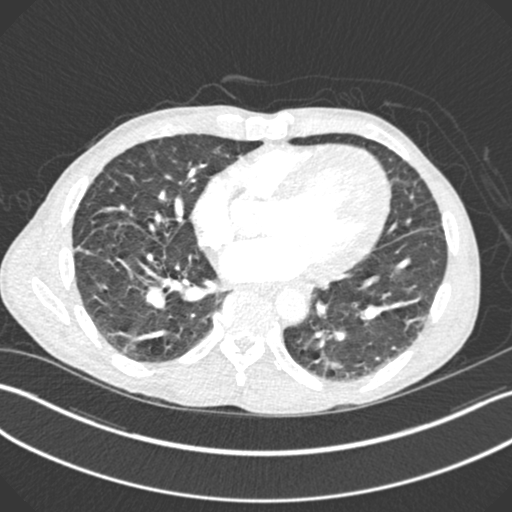
[im 114/284  soft-tissue]
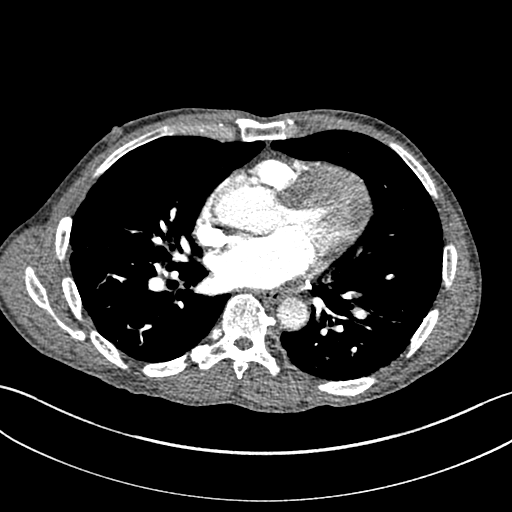
[im 128/284  lung]
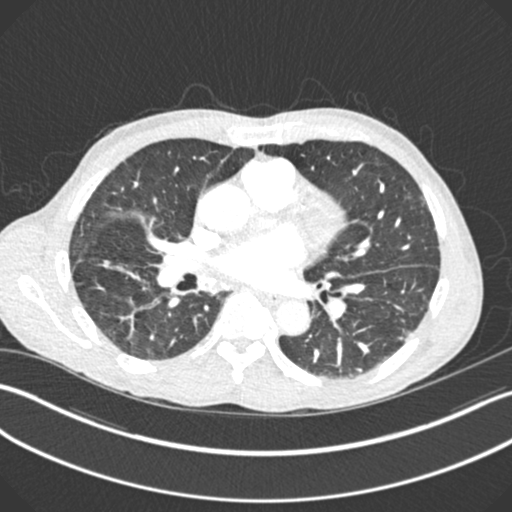
[im 156/284  soft-tissue]
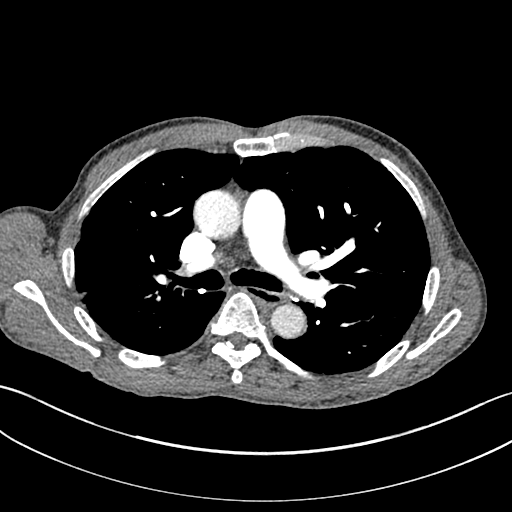
[im 170/284  lung]
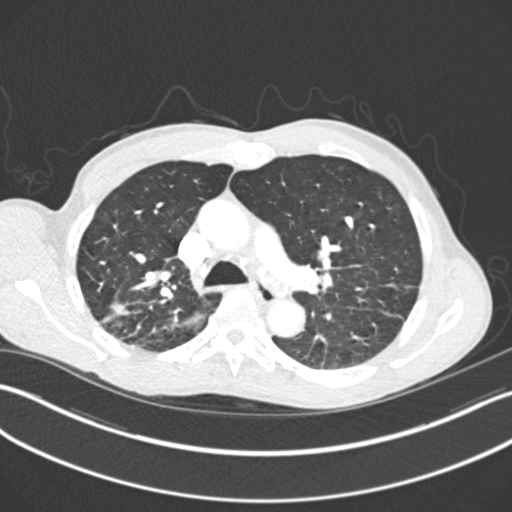
[im 184/284  soft-tissue]
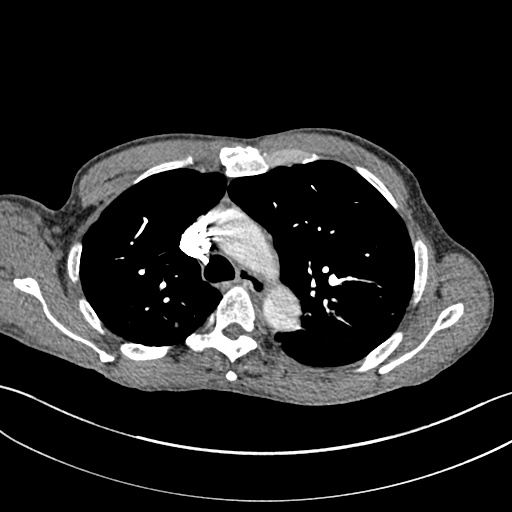
[im 213/284  lung]
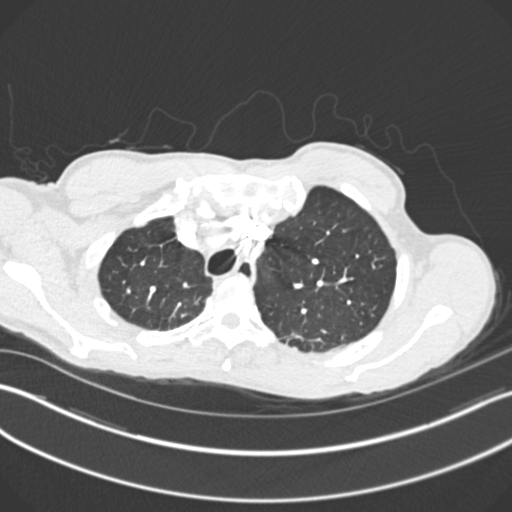
[im 227/284  soft-tissue]
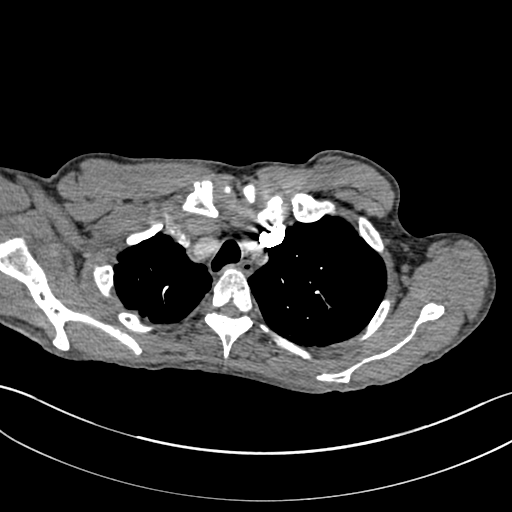
[im 241/284  lung]
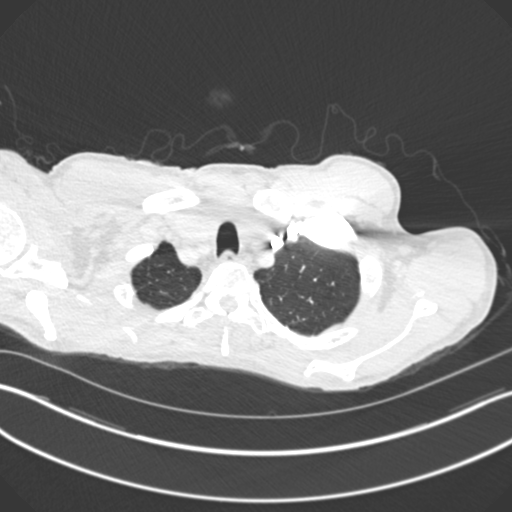
[im 269/284  soft-tissue]
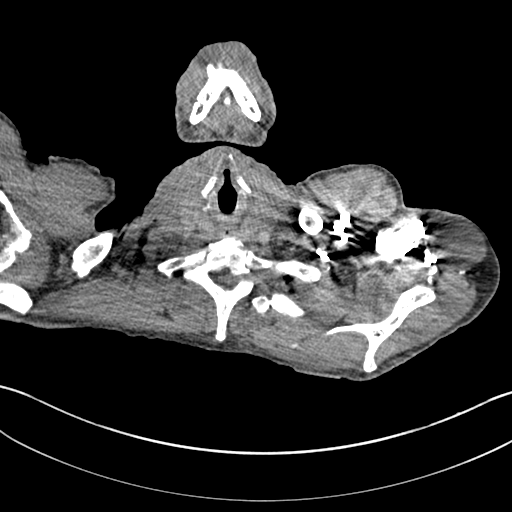

[Series 5: coronal mpr · coronal · 0.59mm/px · 3 of 120 slices shown]
[im 30/120  soft-tissue]
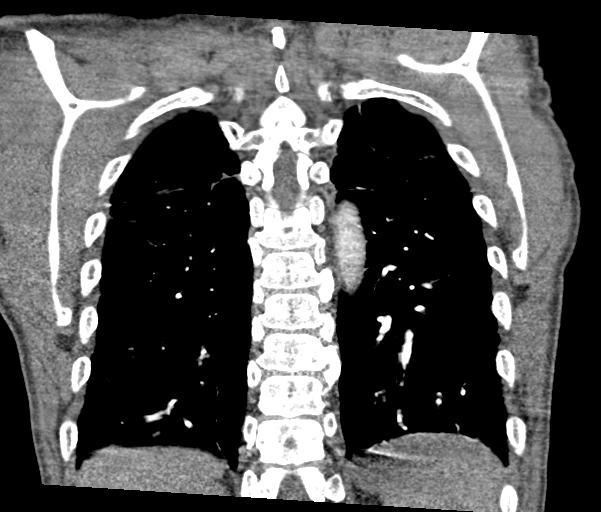
[im 60/120  soft-tissue]
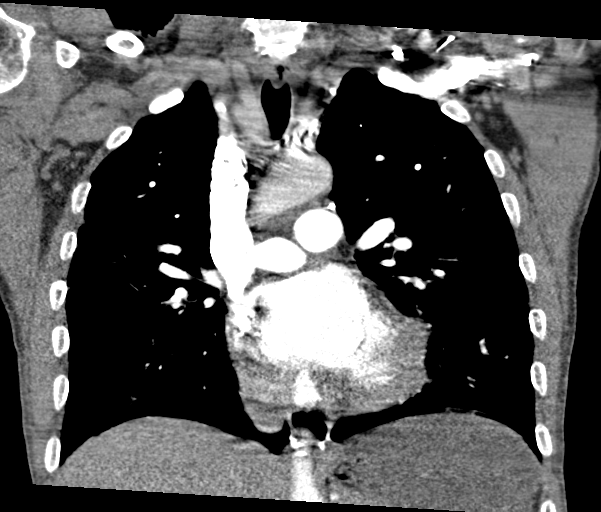
[im 90/120  soft-tissue]
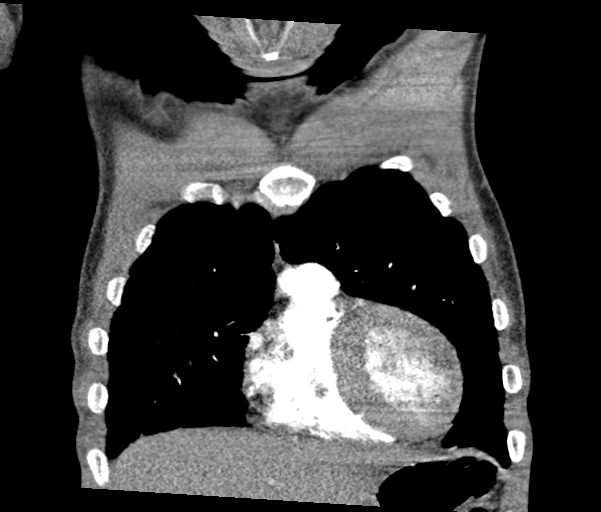

[17 of 46 positions shown; findings below may reference images not displayed]

FINDINGS: CTA CHEST FINDINGS

Cardiovascular: The study is high quality for the evaluation of
pulmonary embolism. There are no filling defects in the central,
lobar, segmental or subsegmental pulmonary artery branches to
suggest acute pulmonary embolism. Mildly atherosclerotic
nonaneurysmal thoracic aorta. Top-normal caliber main pulmonary
artery (3.4 cm diameter). Top-normal heart size. No significant
pericardial fluid/thickening. Left anterior descending coronary
atherosclerosis.

Mediastinum/Nodes: No discrete thyroid nodules. Unremarkable
esophagus. No pathologically enlarged axillary, mediastinal or hilar
lymph nodes. Coarsely calcified nonenlarged AP window, bilateral
paratracheal, subcarinal and bilateral hilar lymph nodes from prior
granulomatous disease.

Lungs/Pleura: No pneumothorax. No pleural effusion. No acute
consolidative airspace disease, lung masses or significant pulmonary
nodules. Numerous thin parenchymal bands scattered throughout both
lungs, predominantly in the posterior mid lungs.

Musculoskeletal: No aggressive appearing focal osseous lesions. Mild
thoracic spondylosis.

Review of the MIP images confirms the above findings.

CT ABDOMEN and PELVIS FINDINGS

Hepatobiliary: Normal liver size. No liver mass. Normal gallbladder
with no radiopaque cholelithiasis. No biliary ductal dilatation.

Pancreas: Normal, with no mass or duct dilation.

Spleen: Normal size. No mass.

Adrenals/Urinary Tract: Normal adrenals. No hydronephrosis.
Nonobstructing 3 mm upper, 2 mm interpolar and 3 mm lower left renal
stones. No right renal stones. Simple 2.5 cm anterior interpolar
right renal cyst. No additional contour deforming renal lesions.
Normal caliber ureters. No ureteral stones. Normal bladder.

Stomach/Bowel: Normal non-distended stomach. Normal caliber small
bowel with no small bowel wall thickening. Normal appendix. Normal
large bowel with no diverticulosis, large bowel wall thickening or
pericolonic fat stranding.

Vascular/Lymphatic: Atherosclerotic nonaneurysmal abdominal aorta.
No pathologically enlarged lymph nodes in the abdomen or pelvis.

Reproductive: Top-normal size prostate. Partially visualized intact
appearing penile prosthesis with minimally distended reservoir in
the anterior right pelvis.

Other: No pneumoperitoneum, ascites or focal fluid collection.

Musculoskeletal: No aggressive appearing focal osseous lesions.
Small sclerotic focus in the medial right iliac bone, nonspecific,
favor degenerative or bone island. Mild lumbar spondylosis.
IMPRESSION: 1. No pulmonary embolism.
2. One vessel coronary atherosclerosis.
3. No acute pulmonary disease. Thin scattered parenchymal bands
predominantly in the mid lungs, compatible with nonspecific
postinfectious/postinflammatory scarring. Calcified mediastinal and
bilateral hilar lymph nodes compatible with history of granulomatous
disease.
4. No acute abnormality in the abdomen or pelvis. Nonobstructing
left nephrolithiasis. No hydronephrosis. No ureteral or bladder
stones.
5.  Aortic Atherosclerosis ([id number]).
